# Patient Record
Sex: Female | Born: 1950 | Race: Black or African American | Hispanic: No | Marital: Married | Smoking: Never smoker
Health system: Southern US, Community
[De-identification: ages and names within clinical notes are randomized; demographics above are authoritative.]

## PROBLEM LIST (undated history)

## (undated) DIAGNOSIS — F0390 Unspecified dementia without behavioral disturbance: Secondary | ICD-10-CM

## (undated) HISTORY — PX: CHOLECYSTECTOMY: SHX55

---

## 2012-09-08 ENCOUNTER — Emergency Department (HOSPITAL_COMMUNITY): Payer: Medicare Other

## 2012-09-08 ENCOUNTER — Encounter (HOSPITAL_COMMUNITY): Payer: Self-pay

## 2012-09-08 ENCOUNTER — Emergency Department (HOSPITAL_COMMUNITY)
Admission: EM | Admit: 2012-09-08 | Discharge: 2012-09-08 | Disposition: A | Discharge status: Home / Self Care | Payer: Medicare Other | Attending: Emergency Medicine | Admitting: Emergency Medicine

## 2012-09-08 DIAGNOSIS — R296 Repeated falls: Secondary | ICD-10-CM | POA: Insufficient documentation

## 2012-09-08 DIAGNOSIS — IMO0002 Reserved for concepts with insufficient information to code with codable children: Secondary | ICD-10-CM | POA: Insufficient documentation

## 2012-09-08 DIAGNOSIS — F29 Unspecified psychosis not due to a substance or known physiological condition: Secondary | ICD-10-CM | POA: Insufficient documentation

## 2012-09-08 DIAGNOSIS — S8990XA Unspecified injury of unspecified lower leg, initial encounter: Secondary | ICD-10-CM | POA: Insufficient documentation

## 2012-09-08 DIAGNOSIS — Z880 Allergy status to penicillin: Secondary | ICD-10-CM | POA: Insufficient documentation

## 2012-09-08 DIAGNOSIS — Z79899 Other long term (current) drug therapy: Secondary | ICD-10-CM | POA: Insufficient documentation

## 2012-09-08 DIAGNOSIS — Y9389 Activity, other specified: Secondary | ICD-10-CM | POA: Insufficient documentation

## 2012-09-08 DIAGNOSIS — F0390 Unspecified dementia without behavioral disturbance: Secondary | ICD-10-CM | POA: Insufficient documentation

## 2012-09-08 DIAGNOSIS — R5381 Other malaise: Secondary | ICD-10-CM | POA: Insufficient documentation

## 2012-09-08 DIAGNOSIS — Y929 Unspecified place or not applicable: Secondary | ICD-10-CM | POA: Insufficient documentation

## 2012-09-08 DIAGNOSIS — S99919A Unspecified injury of unspecified ankle, initial encounter: Secondary | ICD-10-CM | POA: Insufficient documentation

## 2012-09-08 HISTORY — DX: Unspecified dementia, unspecified severity, without behavioral disturbance, psychotic disturbance, mood disturbance, and anxiety: F03.90

## 2012-09-08 LAB — CBC WITH DIFFERENTIAL/PLATELET
Basophils Relative: 0 % (ref 0–1)
Eosinophils Absolute: 0.1 10*3/uL (ref 0.0–0.7)
Eosinophils Relative: 1 % (ref 0–5)
Hemoglobin: 13.1 g/dL (ref 12.0–15.0)
Lymphs Abs: 2.5 10*3/uL (ref 0.7–4.0)
MCH: 31.3 pg (ref 26.0–34.0)
MCHC: 32.8 g/dL (ref 30.0–36.0)
MCV: 95.2 fL (ref 78.0–100.0)
Monocytes Relative: 7 % (ref 3–12)
Neutrophils Relative %: 68 % (ref 43–77)
Platelets: 319 10*3/uL (ref 150–400)
RBC: 4.19 MIL/uL (ref 3.87–5.11)

## 2012-09-08 LAB — COMPREHENSIVE METABOLIC PANEL
ALT: 8 U/L (ref 0–35)
AST: 17 U/L (ref 0–37)
Albumin: 3.4 g/dL — ABNORMAL LOW (ref 3.5–5.2)
CO2: 27 mEq/L (ref 19–32)
Chloride: 103 mEq/L (ref 96–112)
GFR calc non Af Amer: >90 mL/min (ref >90)
Sodium: 138 mEq/L (ref 135–145)
Total Bilirubin: 0.6 mg/dL (ref 0.3–1.2)

## 2012-09-08 LAB — URINALYSIS, ROUTINE W REFLEX MICROSCOPIC
Glucose, UA: NEGATIVE mg/dL
Leukocytes, UA: NEGATIVE
Nitrite: NEGATIVE
Specific Gravity, Urine: 1.029 (ref 1.005–1.030)
pH: 7 (ref 5.0–8.0)

## 2012-09-08 NOTE — ED Provider Notes (Signed)
CSN: [id number]     Arrival date & time 09/08/12  2001 History     First MD Initiated Contact with Patient 09/08/12 2107     Chief Complaint  Patient presents with  . Fall  . Altered Mental Status   HPI  History provided by the patient's husband and sister. Patient is a 62 year old female with history of dementia who presents with concerns of worsening altered metal status and weakness. Patient and husband are from the [place] area but came to stay with family here in [place]. Patient has not had regular medical followup or care for the past 3 years per the family while living in [place]. Today he additional family felt patient was more weak in altered. They state that she is slouched to her right side in her wheelchair and also is having difficulty bearing weight with weakness in both legs. She especially favors her right leg when trying to stand. Last evening patient was near the commode and was reported to be found after a fall in the bathtub right next to the commode. There was no signs of trauma or injury at that time specifically. Patient has been eating and drinking. There has been no fever chills or sweats. No other aggravating or alleviating factors. No other associated symptoms.    Past Medical History  Diagnosis Date  . Dementia    Past Surgical History  Procedure Laterality Date  . Cholecystectomy     No family history on file. History  Substance Use Topics  . Smoking status: Never Smoker   . Smokeless tobacco: Not on file  . Alcohol Use: No   OB History   Grav Para Term Preterm Abortions TAB SAB Ect Mult Living                 Review of Systems  Unable to perform ROS: Patient nonverbal  Constitutional: Negative for fever.  Respiratory: Negative for cough.   Neurological: Positive for weakness. Negative for headaches.  Psychiatric/Behavioral: Positive for confusion.    Allergies  Penicillins  Home Medications   Current Outpatient Rx  Name  Route   Sig  Dispense  Refill  . Multiple Vitamin (MULTIVITAMIN WITH MINERALS) TABS   Oral   Take 1 tablet by mouth daily.          BP 147/90  Pulse 103  Temp(Src) 99.4 F (37.4 C) (Rectal)  Resp 20  SpO2 100% Physical Exam  Nursing note and vitals reviewed. Constitutional: She appears well-developed and well-nourished. No distress.  HENT:  Head: Normocephalic.  Eyes: Conjunctivae and EOM are normal. Pupils are equal, round, and reactive to light.  Cardiovascular: Normal rate and regular rhythm.   Pulmonary/Chest: Effort normal and breath sounds normal. No respiratory distress. She has no wheezes.  Abdominal: Soft. There is no tenderness. There is no rebound and no guarding.  Musculoskeletal: Normal range of motion.  No gross deformities or injuries to extremities. There appears to be mild chronic swelling to the right knee area. Normal passive range of motion. Normal color.  Neurological: She is alert.  Does not communicate. Unable to follow commands. Patient does try to remove clothing during examination.  Skin: Skin is warm and dry. No rash noted.  Psychiatric: She has a normal mood and affect. Her behavior is normal.    ED Course   Procedures   Results for orders placed during the hospital encounter of 09/08/12  URINALYSIS, ROUTINE W REFLEX MICROSCOPIC      Result Value Range  Color, Urine YELLOW  YELLOW   APPearance CLEAR  CLEAR   Specific Gravity, Urine 1.029  1.005 - 1.030   pH 7.0  5.0 - 8.0   Glucose, UA NEGATIVE  NEGATIVE mg/dL   Hgb urine dipstick NEGATIVE  NEGATIVE   Bilirubin Urine NEGATIVE  NEGATIVE   Ketones, ur NEGATIVE  NEGATIVE mg/dL   Protein, ur NEGATIVE  NEGATIVE mg/dL   Urobilinogen, UA 2.0 (*) 0.0 - 1.0 mg/dL   Nitrite NEGATIVE  NEGATIVE   Leukocytes, UA NEGATIVE  NEGATIVE  COMPREHENSIVE METABOLIC PANEL      Result Value Range   Sodium 138  135 - 145 mEq/L   Potassium 3.8  3.5 - 5.1 mEq/L   Chloride 103  96 - 112 mEq/L   CO2 27  19 - 32 mEq/L    Glucose, Bld 118 (*) 70 - 99 mg/dL   BUN 13  6 - 23 mg/dL   Creatinine, Ser 4.09  0.50 - 1.10 mg/dL   Calcium 9.3  8.4 - 81.1 mg/dL   Total Protein 7.1  6.0 - 8.3 g/dL   Albumin 3.4 (*) 3.5 - 5.2 g/dL   AST 17  0 - 37 U/L   ALT 8  0 - 35 U/L   Alkaline Phosphatase 60  39 - 117 U/L   Total Bilirubin 0.6  0.3 - 1.2 mg/dL   GFR calc non Af Amer >90  >90 mL/min   GFR calc Af Amer >90  >90 mL/min  CBC WITH DIFFERENTIAL      Result Value Range   WBC 10.3  4.0 - 10.5 K/uL   RBC 4.19  3.87 - 5.11 MIL/uL   Hemoglobin 13.1  12.0 - 15.0 g/dL   HCT 91.4  78.2 - 95.6 %   MCV 95.2  78.0 - 100.0 fL   MCH 31.3  26.0 - 34.0 pg   MCHC 32.8  30.0 - 36.0 g/dL   RDW 21.3  08.6 - 57.8 %   Platelets 319  150 - 400 K/uL   Neutrophils Relative % 68  43 - 77 %   Neutro Abs 7.0  1.7 - 7.7 K/uL   Lymphocytes Relative 24  12 - 46 %   Lymphs Abs 2.5  0.7 - 4.0 K/uL   Monocytes Relative 7  3 - 12 %   Monocytes Absolute 0.7  0.1 - 1.0 K/uL   Eosinophils Relative 1  0 - 5 %   Eosinophils Absolute 0.1  0.0 - 0.7 K/uL   Basophils Relative 0  0 - 1 %   Basophils Absolute 0.0  0.0 - 0.1 K/uL       Dg Chest 2 View  09/08/2012   *RADIOLOGY REPORT*  Clinical Data: Recent traumatic injury, altered mental status  CHEST - 2 VIEW  Comparison: None.  Findings: The heart and pulmonary vascularity are within normal limits.  The lungs are clear bilaterally.  No acute bony abnormality is seen.  IMPRESSION: No acute abnormality noted.   Original Report Authenticated By: Alcide Clever, M.D.   Dg Pelvis 1-2 Views  09/08/2012   *RADIOLOGY REPORT*  Clinical Data: Fall.  PELVIS - 1-2 VIEW  Comparison: None.  Findings: No pelvic fracture or hip fracture is identified.  Marked degenerative changes are seen involving both hips, right greater than left.  No evidence of diastasis.  Soft tissues are unremarkable.  IMPRESSION: No evidence of pelvic fracture.   Original Report Authenticated By: Irish Lack, M.D.   Ct Head Wo  Contrast  09/08/2012   *RADIOLOGY REPORT*  Clinical Data: Altered mental status, weakness  CT HEAD WITHOUT CONTRAST  Technique:  Contiguous axial images were obtained from the base of the skull through the vertex without contrast.  Comparison: None.  Findings: Cortical volume loss noted with proportional ventricular prominence.  Periventricular white matter hypodensity likely indicates small vessel ischemic change.  No acute hemorrhage, acute infarction, or mass lesion is identified.  These findings are out of proportion to that expected for the patient's age of 62 years. Remote basal ganglial lacunar infarcts are noted.  No midline shift. Soft tissue density in the bilateral external auditory canals likely represents cerumen.  No skull fracture.  Orbits and paranasal sinuses are intact.  IMPRESSION: No acute intracranial finding.   Original Report Authenticated By: Christiana Pellant, M.D.    1. Dementia, without behavioral disturbance      MDM  Patient seen and evaluated. Patient sitting in wheelchair appears calm no acute distress. Per family she has at baseline and does not communicate.  CT of head and x-rays unremarkable. Patient's labs and UA also normal. Patient's family now stating that they are unable to care for the patient due to her severe dementia and requesting case management consult.  Social work has seen the patient. Case management also consult at. At this time family is comfortable taking the patient back home for the night and we will provide resources so they may begin looking for long-term care facilities.   Angus Seller, PA-C 09/09/12 2328

## 2012-09-08 NOTE — Progress Notes (Signed)
CSW spoke with family at bedside.  Pt kept her head covered with sheet most of the time.  Pts sister requested referrals for care.  She reports that her sister and brother in-law have been staying with her in her townhome for about a week and that they can't care for her alone because she works full time and pts husband is disabled.  CSW consulted with [facility] on call and was told that if there was not a skilled need then the pts insurance would not pay for home health services and it would be private pay.  CSW gave family information on ALF/SNF in the [place] area as well as the [facility] area where pt is originally a resident.  CSW gave family listing of home health agencies. Pt sister states that she has been working with an APS from [facility] concerning her sister.  CSW suggested that family follow up with [facility] for possible resources.  Family was appreciative of referrals and CSW concern.  Janet Griffin, Janet Griffin  [phone]  .09/08/2012  11:45 pm

## 2012-09-08 NOTE — ED Notes (Signed)
This morning pt lost balance; family states has noticed changes since Sunday evening; sister states when she got home from work at 630pm realized pt worse; states leaning and slouching; unable to ambulate due to leg weakness; states dragging right leg since Thursday; pt nonverbal for a while per sister with history of dementia; no medical treatment x 3 yrs

## 2012-09-09 NOTE — Progress Notes (Signed)
[facility] ED CM used Medicare.gov to find a listing of medicare providers near zip [phone] (near trina home address and office). The first MD noted is Harvette C Cindra Eves, Tri Marin Shutter, Vashti Hey velazquez  [address] [place], [place] [address] [phone] Cm spke with Cordelia Pen at the office to confirm MD is accepting new medicare patients and requested CM fax clinicals, face sheet to ATTN fax [phone]. Sherri will call Darreld Mclean or Darreld Mclean can call her by Monday if no return calls No appointments available on tomorrow, Thursday and office closed on this Friday  1127 am Spoke with Tessler at [phone] [facility] family practice [address] who is accepting new medicare patient but "there is a long wait list Please try back on Monday" 1131 Katina at [facility] [address], [place] [address] [phone] Confirmed they are not accepting new medicare patients 1139 attempts to reach drsE LIAS [id number] [place], [place] [address] [phone]  &  Jackie Plum [address] [place], [place] [address] [phone] But these were incorrect numbers Left a voice message for Diane at Adult care center [phone] at 1139 requesting a return call if availability for pt to be seen this week as a new medicare pt Left Diane Cm's office and Cm numbers for a return call

## 2012-09-09 NOTE — Progress Notes (Signed)
[facility] ED CM consulted by ED SWs about pt concerns about resource options for pt with Dementia being assisted in her care by her Janet Griffin, from 09/08/12 [facility] ED visit.   [id number] CM placed a call to Janet Griffin, sister, at [phone]. Janet Griffin confirmed the pt is staying with her at her townhome [address] [place] [address] since Thursday, September 03 2012. Reports prior to this the pt was staying with her daughter in [place] until an eviction and APS concern developed.  The daughter brought pt and her husband to stay with [place]. Confirmed coverage is Medicare with some unknown coverage from husband's Veteran's Administration status. Other support systems include family in [place] (another sister).   CM discussed with T Manson Passey the importance of 1) an established pcp relationship in [place] to get orders for home health services/DME and/or placement assistance 2) [facility] DSS assistance- application, community resources - reports she has spoken with the [place] DSS about transferring information to [place] on Monday 7/28 2014 3) [employer] assistance.   CM reviewed in details medicare guidelines, home health [facility]) (length of stay in home, types of [facility] staff available, coverage, primary caregiver, up to 24 hrs before services may be started) and Private duty nursing (PDN-coverage, length of stay in the home types of staff available). CM reviewed availability of HH SW to assist pcp to get pt to alf/snf (if desired disposition) from the community level.  Wt approximately 130 lbs and ht approximately 5'5"-5'6" per sister Confirms pt has not been seen by a doctor in 5-6 years and has not need of a rolling walker in the last 3 years until recently weakness noted. Discussed the locations of the local [facility] hospitals in [place], [place] and clinic in [place], [place] Janet Griffin reports recent transfer to [place] for her job and not being  familiar with the county  [place] agreed to allow CM assist with new medicare pcp with an appointment (within the [place] [address] at around 1200- 1300) and possible RW. Reports pt unable to lift her legs and having difficulty with completing all ADLS. Cm can return a call to her at her office number [phone]. Janet Griffin state she will be following up in the resource information provided (DSS, [place]). Janet Griffin reports pt

## 2012-09-09 NOTE — Progress Notes (Signed)
ED CM updated Trina on the pending call from Sherri of Dr Lovell Sheehan office to her for appointment and rolling walker assist Reviewed other office calls with pending call also from Diane at the adult care center. Darreld Mclean states she has heard from [employer] APS also. Cm received confirmation fax at [phone] & [phone]

## 2012-09-09 NOTE — Progress Notes (Addendum)
CSW received call from pt sister Darreld Mclean brown [phone]. Pt sister shared she was instructed to call back in the morning to speak with a nurse case manager. Per discussion with pt sister, pt is currently living with patient, patient has history of dementia, and is a fall risk. Pt sister shared that APS of [place] was involved and had patient emergency placed with patient sister. Patient sister is concerned about patient safety at home and is interested in placement. Patient sister stated she has left message with APS  in [place], as [place] referred to [employer] APS. This Clinical research associate called [facility] APS  who stated that [facility] had just called. CSW awaiting return call back from [facility] APS to confirm and provide any further information needed.   Doree Albee  [phone] 09/09/2012 8:16am   Addendum: CSW received call from Marye Round [phone] from [facility] APS, and patient will be referred to Placement CSW at DSS who will assist. Per [facility] APS, patient is in need of placement and not protection. Per [place] DSS patient placement will be complicated due to pt daughter still has POA, Patient sister is working with DSS to pursue guardianship to eventually place patient.   Catha Gosselin, LCSWA  [phone] .09/09/2012 11:40am

## 2012-09-10 NOTE — ED Provider Notes (Signed)
Medical screening examination/treatment/procedure(s) were performed by non-physician practitioner and as supervising physician I was immediately available for consultation/collaboration.   Javiel Canepa, MD 09/10/12 1759 

## 2012-11-26 ENCOUNTER — Telehealth: Payer: Self-pay | Admitting: General Practice

## 2012-11-26 NOTE — Telephone Encounter (Signed)
Called pt to set up an appt to Establish care.  Pt's sister say pt will be moving into a assisted living facility and will have a PCP onsite, if situation changes they will give [place] a call to schedule an appt.

## 2014-05-15 ENCOUNTER — Encounter (HOSPITAL_BASED_OUTPATIENT_CLINIC_OR_DEPARTMENT_OTHER): Payer: Self-pay | Admitting: *Deleted

## 2014-05-15 ENCOUNTER — Emergency Department (HOSPITAL_BASED_OUTPATIENT_CLINIC_OR_DEPARTMENT_OTHER): Payer: Medicare Other

## 2014-05-15 ENCOUNTER — Emergency Department (HOSPITAL_BASED_OUTPATIENT_CLINIC_OR_DEPARTMENT_OTHER)
Admission: EM | Admit: 2014-05-15 | Discharge: 2014-05-16 | Disposition: A | Discharge status: Home / Self Care | Payer: Medicare Other | Attending: Emergency Medicine | Admitting: Emergency Medicine

## 2014-05-15 DIAGNOSIS — F0390 Unspecified dementia without behavioral disturbance: Secondary | ICD-10-CM | POA: Diagnosis not present

## 2014-05-15 DIAGNOSIS — S0990XA Unspecified injury of head, initial encounter: Secondary | ICD-10-CM | POA: Insufficient documentation

## 2014-05-15 DIAGNOSIS — Y998 Other external cause status: Secondary | ICD-10-CM | POA: Diagnosis not present

## 2014-05-15 DIAGNOSIS — Z79899 Other long term (current) drug therapy: Secondary | ICD-10-CM | POA: Insufficient documentation

## 2014-05-15 DIAGNOSIS — Y9289 Other specified places as the place of occurrence of the external cause: Secondary | ICD-10-CM | POA: Insufficient documentation

## 2014-05-15 DIAGNOSIS — W19XXXA Unspecified fall, initial encounter: Secondary | ICD-10-CM

## 2014-05-15 DIAGNOSIS — S0003XA Contusion of scalp, initial encounter: Secondary | ICD-10-CM | POA: Diagnosis not present

## 2014-05-15 DIAGNOSIS — W01198A Fall on same level from slipping, tripping and stumbling with subsequent striking against other object, initial encounter: Secondary | ICD-10-CM | POA: Insufficient documentation

## 2014-05-15 DIAGNOSIS — Y9389 Activity, other specified: Secondary | ICD-10-CM | POA: Insufficient documentation

## 2014-05-15 DIAGNOSIS — Z880 Allergy status to penicillin: Secondary | ICD-10-CM | POA: Insufficient documentation

## 2014-05-15 MED ORDER — ACETAMINOPHEN 325 MG PO TABS
650.0000 mg | ORAL_TABLET | Freq: Once | ORAL | Status: AC
  Administered 2014-05-15: 650 mg via ORAL
  Filled 2014-05-15: qty 2

## 2014-05-15 NOTE — ED Provider Notes (Addendum)
CSN: [id number]     Arrival date & time 05/15/14  2042 History  This chart was scribed for Doug Sou, MD by Tonye Royalty, ED Scribe. This patient was seen in room MH06/MH06 and the patient's care was started at 9:47 PM.    LEVEL 5 CAVEAT: dementia History is obtained from patient's sister, who accompanies her Chief Complaint  Patient presents with  . Fall  . Head Injury   The history is provided by the patient and a relative. The history is limited by the condition of the patient. No language interpreter was used.    HPI Comments: Janet Griffin is a 64 y.o. female resident at [facility] with history of dementia who presents to the Emergency Department complaining of fall and head injury at 2000 tonight. Per sister, she was observed to fall to knees and strike her head on her bedside table at nursing home. Sister states she looks fine now besides a bump on her head,and swelling at her right knee but is acting normally. She also has wound on right knee. Gait is unsteady at baseline. She states the patient normally ambulates inpendently but has difficulty due to arthritis in her knee. She intermittently has physical therapy. She has lived at the nursing almost 2 years and is planned to be there permanently. Last tetanus shot was less than 10 years ago. No treatment prior to coming here. Brought by EMS  Past Medical History  Diagnosis Date  . Dementia    Past Surgical History  Procedure Laterality Date  . Cholecystectomy     No family history on file. History  Substance Use Topics  . Smoking status: Never Smoker   . Smokeless tobacco: Not on file  . Alcohol Use: No   OB History    No data available     Review of Systems  Unable to perform ROS: Dementia  HENT:       Hematoma on head  Skin: Positive for wound.  Neurological:       Head injury    Allergies  Penicillins  Home Medications   Prior to Admission medications   Medication Sig Start Date End Date  Taking? Authorizing Provider  memantine (NAMENDA) 10 MG tablet Take 30 mg by mouth 2 (two) times daily.   Yes Historical Provider, MD  traZODone (DESYREL) 100 MG tablet Take 100 mg by mouth at bedtime.   Yes Historical Provider, MD  Multiple Vitamin (MULTIVITAMIN WITH MINERALS) TABS Take 1 tablet by mouth daily.    Historical Provider, MD   BP 175/85 mmHg  Pulse 87  Temp(Src) 98 F (36.7 C) (Oral)  Resp 24  SpO2 100% Physical Exam  Constitutional: She appears well-developed and well-nourished. No distress.  HENT:  Golf ball size hematoma at right parietal scalp. Otherwise normocephalic atraumatic. Bilateral tympanic membranes normal. Poor dentition.  Eyes: Conjunctivae are normal. Pupils are equal, round, and reactive to light.  Neck: Neck supple. No tracheal deviation present. No thyromegaly present.  nontender  Cardiovascular: Normal rate and regular rhythm.   No murmur heard. Pulmonary/Chest: Effort normal and breath sounds normal.  Abdominal: Soft. Bowel sounds are normal. She exhibits no distension. There is no tenderness.  Musculoskeletal: Normal range of motion. She exhibits no edema or tenderness.  No tenderness along entire spine. Pelvis stable nontender. Right lower extremity dime-sized abrasion over anterior knee with corresponding swelling. DP pulse 2+. No ligamentous laxity .all other extremities no contusion abrasion or tenderness neurovascularly intact  Neurological: She is alert.  Coordination normal.  does not follow simple commands, nonverbal, moves all extremities  Skin: Skin is warm and dry. No rash noted.  Psychiatric: She has a normal mood and affect.  Nursing note and vitals reviewed.   ED Course  Procedures (including critical care time)  DIAGNOSTIC STUDIES: Oxygen Saturation is 100% on room air, normal by my interpretation.    COORDINATION OF CARE: 10:07 PM Discussed treatment plan with patient at beside, the patient agrees with the plan and has no  further questions at this time.   Labs Review Labs Reviewed - No data to display  Imaging Review No results found.   EKG Interpretation None      12 midnight patient appears to be resting comfortably.looks at baseline. Her sister who accompanies her Results for orders placed or performed during the hospital encounter of 09/08/12  Urinalysis, Routine w reflex microscopic  Result Value Ref Range   Color, Urine YELLOW YELLOW   APPearance CLEAR CLEAR   Specific Gravity, Urine 1.029 1.005 - 1.030   pH 7.0 5.0 - 8.0   Glucose, UA NEGATIVE NEGATIVE mg/dL   Hgb urine dipstick NEGATIVE NEGATIVE   Bilirubin Urine NEGATIVE NEGATIVE   Ketones, ur NEGATIVE NEGATIVE mg/dL   Protein, ur NEGATIVE NEGATIVE mg/dL   Urobilinogen, UA 2.0 (H) 0.0 - 1.0 mg/dL   Nitrite NEGATIVE NEGATIVE   Leukocytes, UA NEGATIVE NEGATIVE  Comprehensive metabolic panel  Result Value Ref Range   Sodium 138 135 - 145 mEq/L   Potassium 3.8 3.5 - 5.1 mEq/L   Chloride 103 96 - 112 mEq/L   CO2 27 19 - 32 mEq/L   Glucose, Bld 118 (H) 70 - 99 mg/dL   BUN 13 6 - 23 mg/dL   Creatinine, Ser 1.61 0.50 - 1.10 mg/dL   Calcium 9.3 8.4 - 09.6 mg/dL   Total Protein 7.1 6.0 - 8.3 g/dL   Albumin 3.4 (L) 3.5 - 5.2 g/dL   AST 17 0 - 37 U/L   ALT 8 0 - 35 U/L   Alkaline Phosphatase 60 39 - 117 U/L   Total Bilirubin 0.6 0.3 - 1.2 mg/dL   GFR calc non Af Amer >90 >90 mL/min   GFR calc Af Amer >90 >90 mL/min  CBC with Differential  Result Value Ref Range   WBC 10.3 4.0 - 10.5 K/uL   RBC 4.19 3.87 - 5.11 MIL/uL   Hemoglobin 13.1 12.0 - 15.0 g/dL   HCT 04.5 40.9 - 81.1 %   MCV 95.2 78.0 - 100.0 fL   MCH 31.3 26.0 - 34.0 pg   MCHC 32.8 30.0 - 36.0 g/dL   RDW 91.4 78.2 - 95.6 %   Platelets 319 150 - 400 K/uL   Neutrophils Relative % 68 43 - 77 %   Neutro Abs 7.0 1.7 - 7.7 K/uL   Lymphocytes Relative 24 12 - 46 %   Lymphs Abs 2.5 0.7 - 4.0 K/uL   Monocytes Relative 7 3 - 12 %   Monocytes Absolute 0.7 0.1 - 1.0 K/uL    Eosinophils Relative 1 0 - 5 %   Eosinophils Absolute 0.1 0.0 - 0.7 K/uL   Basophils Relative 0 0 - 1 %   Basophils Absolute 0.0 0.0 - 0.1 K/uL   Ct Head Wo Contrast  05/15/2014   CLINICAL DATA:  Fall and head injury at 2000 hours tonight. Fell to the knees and struck head on bedside table. Right parietal scalp hematoma.  EXAM: CT HEAD WITHOUT CONTRAST  CT CERVICAL SPINE WITHOUT CONTRAST  TECHNIQUE: Multidetector CT imaging of the head and cervical spine was performed following the standard protocol without intravenous contrast. Multiplanar CT image reconstructions of the cervical spine were also generated.  COMPARISON:  CT head 09/08/2012  FINDINGS: CT HEAD FINDINGS  Prominent diffuse cerebral atrophy. Ventricular dilatation likely due to central atrophy. Low-attenuation changes in the deep white matter consistent with small vessel ischemia. No mass effect or midline shift. No abnormal extra-axial fluid collections. Gray-white matter junctions are distinct. Basal cisterns are not effaced. No evidence of acute intracranial hemorrhage. No depressed skull fractures. Visualized paranasal sinuses and mastoid air cells are not opacified.  CT CERVICAL SPINE FINDINGS  There is reversal of the cervical lordosis which may be due to patient positioning or degenerative changes but ligamentous injury or muscle spasm can also have this appearance and are not excluded. No anterior subluxation. Normal alignment of the facet joints. C1-2 articulation appears intact. No vertebral compression deformities. Diffuse degenerative change throughout the cervical spine with narrowed cervical interspaces and prominent endplate hypertrophic changes. Degenerative changes throughout the facet joints. No prevertebral soft tissue swelling. No focal bone lesion or bone destruction. Bone cortex and trabecular architecture appear intact. Soft tissues are unremarkable.  IMPRESSION: No acute intracranial abnormalities. Chronic atrophy and small  vessel ischemic changes.  Degenerative changes in the cervical spine. Nonspecific reversal of the usual cervical lordosis. No displaced acute fractures identified.   Electronically Signed   By: Burman NievesWilliam  Stevens M.D.   On: 05/15/2014 23:15   Ct Cervical Spine Wo Contrast  05/15/2014   CLINICAL DATA:  Fall and head injury at 2000 hours tonight. Fell to the knees and struck head on bedside table. Right parietal scalp hematoma.  EXAM: CT HEAD WITHOUT CONTRAST  CT CERVICAL SPINE WITHOUT CONTRAST  TECHNIQUE: Multidetector CT imaging of the head and cervical spine was performed following the standard protocol without intravenous contrast. Multiplanar CT image reconstructions of the cervical spine were also generated.  COMPARISON:  CT head 09/08/2012  FINDINGS: CT HEAD FINDINGS  Prominent diffuse cerebral atrophy. Ventricular dilatation likely due to central atrophy. Low-attenuation changes in the deep white matter consistent with small vessel ischemia. No mass effect or midline shift. No abnormal extra-axial fluid collections. Gray-white matter junctions are distinct. Basal cisterns are not effaced. No evidence of acute intracranial hemorrhage. No depressed skull fractures. Visualized paranasal sinuses and mastoid air cells are not opacified.  CT CERVICAL SPINE FINDINGS  There is reversal of the cervical lordosis which may be due to patient positioning or degenerative changes but ligamentous injury or muscle spasm can also have this appearance and are not excluded. No anterior subluxation. Normal alignment of the facet joints. C1-2 articulation appears intact. No vertebral compression deformities. Diffuse degenerative change throughout the cervical spine with narrowed cervical interspaces and prominent endplate hypertrophic changes. Degenerative changes throughout the facet joints. No prevertebral soft tissue swelling. No focal bone lesion or bone destruction. Bone cortex and trabecular architecture appear intact. Soft  tissues are unremarkable.  IMPRESSION: No acute intracranial abnormalities. Chronic atrophy and small vessel ischemic changes.  Degenerative changes in the cervical spine. Nonspecific reversal of the usual cervical lordosis. No displaced acute fractures identified.   Electronically Signed   By: Burman NievesWilliam  Stevens M.D.   On: 05/15/2014 23:15   Dg Knee Complete 4 Views Right  05/15/2014   CLINICAL DATA:  Larey SeatFell at 2000 hours tonight. Fell to the knees with wound on right knee.  EXAM: RIGHT KNEE - COMPLETE  4+ VIEW  COMPARISON:  None.  FINDINGS: Degenerative changes in the right knee with medial greater than lateral compartment narrowing. Hypertrophic changes most prominent in the patellofemoral compartment. No significant effusion. No evidence of acute fracture or dislocation.  IMPRESSION: Negative.   Electronically Signed   By: Burman Nieves M.D.   On: 05/15/2014 23:31    MDM  Plan Tylenol for pain. Diagnoses #1 fall #2 contusions multiple sites Final diagnoses:  None      I personally performed the services described in this documentation, which was scribed in my presence. The recorded information has been reviewed and considered.    Doug Sou, MD 05/16/14 6045  Doug Sou, MD 05/16/14 4098

## 2014-05-15 NOTE — ED Notes (Addendum)
I placed patient in gown upon arrival. Patient is agressively grinding her teeth. She kept pulling off oxy probe so I placed tape-on probe to get reading. I took full set of vitals. There is a small contusion to patient's head under braid line (top/back of head). I also noticed a small abrasion to patient's right knee.

## 2014-05-15 NOTE — ED Notes (Signed)
[facility]. Home staff state on arrival to her room to get her supper tray. Pt observed to fall to knees and hit head on bedside table. No LOC.

## 2014-05-16 NOTE — ED Notes (Signed)
Discharge instructions given to pt's sister and called to [place], med tech at [facility]. [facility] has been called for transport. [name], Ashanti Littles Lee, RN

## 2014-05-16 NOTE — ED Notes (Signed)
[facility] arrived to transport pt. Janet Griffin, Janet Soltero Lee, RN

## 2014-05-16 NOTE — Discharge Instructions (Signed)
Contusion Ms. Lenard GallowayMickey can have Tylenol 650 mg every 4 hours as needed for pain. CT scans of the brain, cervical spine and x-ray of the right knee showed no broken bones or brain injury A contusion is a deep bruise. Contusions happen when an injury causes bleeding under the skin. Signs of bruising include pain, puffiness (swelling), and discolored skin. The contusion may turn blue, purple, or yellow. HOME CARE   Put ice on the injured area.  Put ice in a plastic bag.  Place a towel between your skin and the bag.  Leave the ice on for 15-20 minutes, 03-04 times a day.  Only take medicine as told by your doctor.  Rest the injured area.  If possible, raise (elevate) the injured area to lessen puffiness. GET HELP RIGHT AWAY IF:   You have more bruising or puffiness.  You have pain that is getting worse.  Your puffiness or pain is not helped by medicine. MAKE SURE YOU:   Understand these instructions.  Will watch your condition.  Will get help right away if you are not doing well or get worse. Document Released: 07/17/2007 Document Revised: 04/22/2011 Document Reviewed: 12/03/2010 [facility] Patient Information 2015 [employer] [place]. This information is not intended to replace advice given to you by your health care provider. Make sure you discuss any questions you have with your health care provider.

## 2015-10-31 ENCOUNTER — Non-Acute Institutional Stay (SKILLED_NURSING_FACILITY): Payer: Medicare Other | Admitting: Internal Medicine

## 2015-10-31 ENCOUNTER — Encounter: Payer: Self-pay | Admitting: Internal Medicine

## 2015-10-31 DIAGNOSIS — K5909 Other constipation: Secondary | ICD-10-CM | POA: Diagnosis not present

## 2015-10-31 DIAGNOSIS — R1312 Dysphagia, oropharyngeal phase: Secondary | ICD-10-CM | POA: Diagnosis not present

## 2015-10-31 DIAGNOSIS — F0280 Dementia in other diseases classified elsewhere without behavioral disturbance: Secondary | ICD-10-CM

## 2015-10-31 DIAGNOSIS — G308 Other Alzheimer's disease: Secondary | ICD-10-CM

## 2015-10-31 DIAGNOSIS — I10 Essential (primary) hypertension: Secondary | ICD-10-CM | POA: Diagnosis not present

## 2015-10-31 DIAGNOSIS — K219 Gastro-esophageal reflux disease without esophagitis: Secondary | ICD-10-CM

## 2015-10-31 DIAGNOSIS — Z86718 Personal history of other venous thrombosis and embolism: Secondary | ICD-10-CM

## 2015-10-31 DIAGNOSIS — M62421 Contracture of muscle, right upper arm: Secondary | ICD-10-CM | POA: Diagnosis not present

## 2015-10-31 DIAGNOSIS — G4700 Insomnia, unspecified: Secondary | ICD-10-CM | POA: Diagnosis not present

## 2015-10-31 DIAGNOSIS — H04123 Dry eye syndrome of bilateral lacrimal glands: Secondary | ICD-10-CM

## 2015-10-31 DIAGNOSIS — E785 Hyperlipidemia, unspecified: Secondary | ICD-10-CM | POA: Diagnosis not present

## 2015-10-31 NOTE — Progress Notes (Signed)
[facility]  PCP: Keane PoliceSLADE-HARTMAN, [name], MD   Code Status: Full Code  Goals of care: Advanced Directive information No flowsheet data found.     Extended Emergency Contact Information Primary Emergency Contact: [facility] InstituteBrown,Trina Address: [address]          [place], [place] [address] [place] Home Phone: [phone] Work Phone: [phone] Relation: Sister Secondary Emergency Contact: Levey,James Address: [address]          [place], [place] [address] [place] Home Phone: [phone] Relation: Spouse   Allergies  Allergen Reactions  . Penicillins Hives    Chief Complaint  Patient presents with  . New Admit To SNF    New Admission Visit     HPI:  Patient is a 65 y.o. female seen today for new admission visit. She is here for long term care from another facility. She has medical history of chronic embolism, dysphagia, right shoulder contracture, HTN, dementia among others. She is under total care. She is non ambulatory. She has supportive family. She is on dysphagia diet and non verbal.    Review of Systems: Unable to obtain. Per staff, no fall reported. No pressure ulcer and wound reported.   Past Medical History:  Diagnosis Date  . Dementia    Past Surgical History:  Procedure Laterality Date  . CHOLECYSTECTOMY     Social History:   reports that she has never smoked. She does not have any smokeless tobacco history on file. She reports that she does not drink alcohol. Her drug history is not on file.  No family history on file.  Medications:   Medication List       Accurate as of 10/31/15 11:26 AM. Always use your most recent med list.          acetaminophen 325 MG tablet Commonly known as:  TYLENOL Take 650 mg by mouth 3 (three) times daily.   amLODipine 5 MG tablet Commonly known as:  NORVASC Take 5 mg by mouth daily.   ARTIFICIAL TEARS OP Apply 1 drop to eye 4 (four) times  daily as needed.   bisacodyl 10 MG suppository Commonly known as:  DULCOLAX Place 10 mg rectally daily as needed for moderate constipation.   capsaicin 0.025 % cream Commonly known as:  ZOSTRIX Apply 1 application topically 3 (three) times daily. Apply thin layer to bilateral knees   ELIQUIS 5 MG Tabs tablet Generic drug:  apixaban Take 5 mg by mouth daily.   hydrochlorothiazide 12.5 MG capsule Commonly known as:  MICROZIDE Take 12.5 mg by mouth daily.   HYDROcodone-acetaminophen 5-325 MG tablet Commonly known as:  NORCO/VICODIN Take 1 tablet by mouth every 6 (six) hours as needed for moderate pain. Take 1/2 tab   Melatonin 3 MG Tabs Take 1 tablet by mouth at bedtime.   memantine 10 MG tablet Commonly known as:  NAMENDA Take 30 mg by mouth 2 (two) times daily.   ondansetron 4 MG tablet Commonly known as:  ZOFRAN Take 4 mg by mouth every 8 (eight) hours as needed for nausea or vomiting.   polyethylene glycol packet Commonly known as:  MIRALAX / GLYCOLAX Take 17 g by mouth at bedtime.   ranitidine 75 MG tablet Commonly known as:  ZANTAC Take 75 mg by mouth at bedtime.   senna 8.6 MG tablet Commonly known as:  SENOKOT Take 1 tablet by mouth as needed for constipation.   traZODone 100 MG tablet Commonly known  as:  DESYREL Take 100 mg by mouth at bedtime.       Immunizations: Immunization History  Administered Date(s) Administered  . PPD Test 10/28/2015     Physical Exam:  Vitals:   10/31/15 1115  BP: 124/68  Pulse: 80  Resp: 20  Temp: 98 F (36.7 C)  TempSrc: Oral  SpO2: 96%   There is no height or weight on file to calculate BMI.  General- elderly female, well built, in no acute distress Head- normocephalic, atraumatic Throat- moist mucus membrane, halitosis present Eyes- PERRLA, EOMI, no pallor, no icterus, no discharge Neck- no cervical lymphadenopathy Cardiovascular- normal s1,s2, no murmur, no leg edema Respiratory- bilateral clear to  auscultation, no wheeze, no rhonchi, no crackles, no use of accessory muscles Abdomen- bowel sounds present, soft, non tender Musculoskeletal- contracture to right arm with spasticity noted, unable to move her legs, no leg edema, under total care Neurological- alert and oriented to self, non verbal Skin- warm and dry    Labs reviewed: none available for review   Assessment/Plan  Dementia without behavioral disturbance Provide supportive care. Fall precautions and pressure ulcer prophylaxis. Continue namenda  Right arm contracture With spasticity. Continue norco 5-325 mg half a tab qid scheduled and 1 tab q6h prn pain. To work with PT and OT  HTN Monitor bp reading, check bmp, continue amlodipine 5 mg daily and HCTZ 12.5 mg daily  Dysphagia On puree with honey thick liquid, aspiration precautions, SLP to follow  Dry eyes Continue artifical tear drop  Insomnia On melatonin and trazodone, monitor  gerd Stable. Continue ranitidine 75 mg daily at bedtime   History of left DVT On apixaban 5 mg daily. Continue this for now and to review records to evaluate for duration of treatment  Constipation On miralax and senna, continue this   Goals of care: long term care   Labs/tests ordered: cbc, cmp, lipid panel, tsh, a1c 11/01/15  Family/ staff Communication: reviewed care plan with patient and nursing supervisor    Oneal Grout, MD Internal Medicine [facility] [address] [place], [place] [address] Cell Phone (Monday-Friday 8 am - 5 pm): [phone] On Call: [phone] and follow prompts after 5 pm and on weekends Office Phone: [phone] Office Fax: [phone]

## 2015-11-02 LAB — HEPATIC FUNCTION PANEL
ALK PHOS: 60 U/L (ref 25–125)
ALT: 42 U/L — AB (ref 7–35)
AST: 26 U/L (ref 13–35)
Bilirubin, Total: 1.1 mg/dL

## 2015-11-02 LAB — CBC AND DIFFERENTIAL
HCT: 47 % — AB (ref 36–46)
Hemoglobin: 15.6 g/dL (ref 12.0–16.0)
Platelets: 330 10*3/uL (ref 150–399)
WBC: 6.2 10^3/mL

## 2015-11-02 LAB — LIPID PANEL
Cholesterol: 211 mg/dL — AB (ref 0–200)
HDL: 45 mg/dL (ref 35–70)
LDL CALC: 146 mg/dL
TRIGLYCERIDES: 101 mg/dL (ref 40–160)

## 2015-11-02 LAB — BASIC METABOLIC PANEL
BUN: 10 mg/dL (ref 4–21)
CREATININE: 0.8 mg/dL (ref 0.5–1.1)
Glucose: 120 mg/dL
POTASSIUM: 3.8 mmol/L (ref 3.4–5.3)
Sodium: 142 mmol/L (ref 137–147)

## 2015-11-02 LAB — HEMOGLOBIN A1C: Hemoglobin A1C: 5

## 2015-11-02 LAB — TSH: TSH: 2.17 u[IU]/mL (ref 0.41–5.90)

## 2015-11-06 ENCOUNTER — Encounter: Payer: Self-pay | Admitting: Family

## 2015-11-06 NOTE — Progress Notes (Signed)
Location:      Place of Service:    Provider:   Keane PoliceSLADE-HARTMAN, [name], MD  Patient Care Team: Keane PoliceVenezela Slade-Hartman, MD as PCP - General (Family Medicine)  Extended Emergency Contact Information Primary Emergency Contact: [facility],Trina Address: [address]          [place], [place] [address] [place] Home Phone: [phone] Work Phone: [phone] Relation: Sister Secondary Emergency Contact: Chestnut,James Address: [address] [place]          , [place] [address] [place] Home Phone: [phone] Relation: Spouse  Code Status  Goals of care: Advanced Directive information Advanced Directives 12/27/2015  Does Patient Have a Medical Advance Directive? Yes  Type of Advance Directive Out of facility DNR (pink MOST or yellow form)  Does patient want to make changes to medical advance directive? No - Patient declined  Copy of Healthcare Power of Attorney in Chart? Yes     Chief Complaint  Patient presents with  . Error  . Error    HPI:  Pt is a 65 y.o. female seen today for an acute visit for    Past Medical History:  Diagnosis Date  . Dementia    Past Surgical History:  Procedure Laterality Date  . CHOLECYSTECTOMY      Allergies  Allergen Reactions  . Penicillins Hives and Anaphylaxis    Allergies as of 11/06/2015      Reactions   Penicillins Hives      Medication List       Accurate as of 11/06/15 11:59 PM. Always use your most recent med list.          acetaminophen 325 MG tablet Commonly known as:  TYLENOL Take 650 mg by mouth 3 (three) times daily.   amLODipine 5 MG tablet Commonly known as:  NORVASC Take 5 mg by mouth daily.   ARTIFICIAL TEARS OP Apply 1 drop to eye 4 (four) times daily as needed.   bisacodyl 10 MG suppository Commonly known as:  DULCOLAX Place 10 mg rectally daily as needed for moderate constipation.   capsaicin 0.025 % cream Commonly known as:  ZOSTRIX Apply 1  application topically 3 (three) times daily. Apply thin layer to bilateral knees   ELIQUIS 5 MG Tabs tablet Generic drug:  apixaban Take 5 mg by mouth daily.   hydrochlorothiazide 12.5 MG capsule Commonly known as:  MICROZIDE Take 12.5 mg by mouth daily.   HYDROcodone-acetaminophen 5-325 MG tablet Commonly known as:  NORCO/VICODIN Take 1 tablet by mouth every 6 (six) hours as needed for moderate pain. Take 1/2 tab four times a day (hold for sedation)   Melatonin 3 MG Tabs Take 1 tablet by mouth at bedtime.   memantine 10 MG tablet Commonly known as:  NAMENDA Take 30 mg by mouth 2 (two) times daily.   ondansetron 4 MG tablet Commonly known as:  ZOFRAN Take 4 mg by mouth every 8 (eight) hours as needed for nausea or vomiting.   polyethylene glycol packet Commonly known as:  MIRALAX / GLYCOLAX Take 17 g by mouth at bedtime.   ranitidine 75 MG tablet Commonly known as:  ZANTAC Take 75 mg by mouth at bedtime.   senna 8.6 MG tablet Commonly known as:  SENOKOT Take 1 tablet by mouth at bedtime as needed for constipation.   traZODone 100 MG tablet Commonly known as:  DESYREL Take 100 mg by mouth at bedtime.       Review of Systems  Immunization  History  Administered Date(s) Administered  . Influenza-Unspecified 11/16/2015  . PPD Test 10/28/2015   There are no preventive care reminders to display for this patient. No flowsheet data found. Functional Status Survey:    Vitals:   There is no height or weight on file to calculate BMI. Physical Exam  Labs reviewed:  Recent Labs  12/17/15 0852  12/21/15 0400 12/22/15 0529 12/23/15 1340  NA  --   < > 136 140 139  K  --   < > 5.0 4.2 3.1*  CL  --   < > 108 117* 111  CO2  --   < > 24 18* 22  GLUCOSE  --   < > 306* 200* 104*  BUN  --   < > <5* <5* <5*  CREATININE  --   < > 0.61 0.47 0.53  CALCIUM  --   < > 7.4* 6.1* 7.7*  MG 2.2  --   --   --   --   < > = values in this interval not displayed.  Recent  Labs  11/02/15 12/15/15 2032 12/16/15 0030  AST 26 112* 94*  ALT 42* 156* 127*  ALKPHOS 60 77 62  BILITOT  --  3.8* 2.9*  PROT  --  7.8 6.9  ALBUMIN  --  3.9 3.5    Recent Labs  12/15/15 2032  12/20/15 0500 12/21/15 0400 12/22/15 0529  WBC 16.2*  < > 11.7* 7.7 8.7  NEUTROABS 14.2*  --   --   --  4.9  HGB 17.8*  < > 9.7* 9.6* 9.8*  HCT 55.5*  < > 30.2* 29.5* 30.1*  MCV 101.1*  < > 97.7 97.0 97.1  PLT 284  < > 178 163 192  < > = values in this interval not displayed. Lab Results  Component Value Date   TSH 0.906 12/16/2015   Lab Results  Component Value Date   HGBA1C 5.4 12/16/2015   Lab Results  Component Value Date   CHOL 211 (A) 11/02/2015   HDL 45 11/02/2015   LDLCALC 146 11/02/2015   TRIG 101 11/02/2015    Significant Diagnostic Results in last 30 days:  No results found.  Assessment/Plan There are no diagnoses linked to this encounter.   Family/ staff Communication:  Labs/tests ordered  This encounter was created in error - please disregard.

## 2015-11-09 LAB — BASIC METABOLIC PANEL
BUN: 14 mg/dL (ref 4–21)
CREATININE: 0.8 mg/dL (ref 0.5–1.1)
Glucose: 114 mg/dL
Potassium: 4.1 mmol/L (ref 3.4–5.3)
Sodium: 140 mmol/L (ref 137–147)

## 2015-11-09 LAB — CBC AND DIFFERENTIAL
HEMATOCRIT: 50 % — AB (ref 36–46)
Hemoglobin: 16.3 g/dL — AB (ref 12.0–16.0)
WBC: 6.6 10^3/mL

## 2015-11-30 ENCOUNTER — Encounter: Payer: Self-pay | Admitting: Internal Medicine

## 2015-11-30 ENCOUNTER — Non-Acute Institutional Stay (SKILLED_NURSING_FACILITY): Payer: Medicare Other | Admitting: Internal Medicine

## 2015-11-30 DIAGNOSIS — E7800 Pure hypercholesterolemia, unspecified: Secondary | ICD-10-CM | POA: Insufficient documentation

## 2015-11-30 DIAGNOSIS — Z86718 Personal history of other venous thrombosis and embolism: Secondary | ICD-10-CM | POA: Diagnosis not present

## 2015-11-30 DIAGNOSIS — I10 Essential (primary) hypertension: Secondary | ICD-10-CM | POA: Diagnosis not present

## 2015-11-30 DIAGNOSIS — K219 Gastro-esophageal reflux disease without esophagitis: Secondary | ICD-10-CM | POA: Insufficient documentation

## 2015-11-30 NOTE — Progress Notes (Signed)
[facility]  PCP: Keane PoliceSLADE-HARTMAN, [name], MD   Code Status: Full Code  Goals of care: Advanced Directive information Advanced Directives 11/06/2015  Does patient have an advance directive? No       Extended Emergency Contact Information Primary Emergency Contact: [facility] Address: [address]          [place], [place] [address] [place] Home Phone: [phone] Work Phone: [phone] Relation: Sister Secondary Emergency Contact: Kimball,James Address: [address]          [place], [place] [address] [place] Home Phone: [phone] Relation: Spouse   Allergies  Allergen Reactions  . Penicillins Hives    Chief Complaint  Patient presents with  . Medical Management of Chronic Issues    Routine Visit     HPI:  Patient is a 65 y.o. female seen today for long term care. She has been at her baseline per nursing staff. She is unable to participate in HPI and ROS. She is bed bound and is under total care. She has advanced dementia.  Review of Systems: Unable to obtain    Past Medical History:  Diagnosis Date  . Dementia    Past Surgical History:  Procedure Laterality Date  . CHOLECYSTECTOMY       Medications:   Medication List       Accurate as of 11/30/15 11:37 AM. Always use your most recent med list.          amLODipine 5 MG tablet Commonly known as:  NORVASC Take 5 mg by mouth daily.   ARTIFICIAL TEARS OP Apply 1 drop to eye 4 (four) times daily as needed.   bisacodyl 10 MG suppository Commonly known as:  DULCOLAX Place 10 mg rectally daily as needed for moderate constipation.   capsaicin 0.025 % cream Commonly known as:  ZOSTRIX Apply 1 application topically 3 (three) times daily. Apply thin layer to bilateral knees   ELIQUIS 5 MG Tabs tablet Generic drug:  apixaban Take 5 mg by mouth daily.   hydrochlorothiazide 12.5 MG capsule Commonly known as:  MICROZIDE Take  12.5 mg by mouth daily.   HYDROcodone-acetaminophen 5-325 MG tablet Commonly known as:  NORCO/VICODIN Take 1 tablet by mouth every 6 (six) hours as needed for moderate pain. Take 1/2 tab four times a day (hold for sedation)   Melatonin 3 MG Tabs Take 1 tablet by mouth at bedtime.   memantine 10 MG tablet Commonly known as:  NAMENDA Take 30 mg by mouth 2 (two) times daily.   ondansetron 4 MG tablet Commonly known as:  ZOFRAN Take 4 mg by mouth every 8 (eight) hours as needed for nausea or vomiting.   polyethylene glycol packet Commonly known as:  MIRALAX / GLYCOLAX Take 17 g by mouth at bedtime.   ranitidine 75 MG tablet Commonly known as:  ZANTAC Take 75 mg by mouth at bedtime.   senna 8.6 MG tablet Commonly known as:  SENOKOT Take 1 tablet by mouth as needed for constipation.   simvastatin 10 MG tablet Commonly known as:  ZOCOR Take 10 mg by mouth daily.   traZODone 100 MG tablet Commonly known as:  DESYREL Take 100 mg by mouth at bedtime.       Immunizations: Immunization History  Administered Date(s) Administered  . PPD Test 10/28/2015     Physical Exam: Vitals:   11/30/15 1129  BP: (!) 142/89  Pulse: 76  Resp: 18  Temp: 98.2 F (36.8  C)  TempSrc: Oral  SpO2: 96%  Weight: 176 lb 3.2 oz (79.9 kg)  Height: 5\' 7"  (1.702 m)   Body mass index is 27.6 kg/m.  General- adult female, well built, chronically ill appearing, in no acute distress, non verbal Head- normocephalic, atraumatic Nose- no nasal discharge Throat- moist mucus membrane Eyes- PERRLA, EOMI, no pallor, no icterus Neck- no cervical lymphadenopathy Cardiovascular- normal s1,s2, no murmur, no leg edema Respiratory- bilateral clear to auscultation, no wheeze, no rhonchi, no crackles, no use of accessory muscles Abdomen- bowel sounds present, soft, non tender Musculoskeletal- in bed all the time, under total care with her ADLs, PROM to all extremities and to UE shows some resistance with  contracture to right shoulder Neurological- unable to assess Skin- warm and dry   Labs reviewed: Basic Metabolic Panel:  Recent Labs  16/10/96 11/09/15  NA 142 140  K 3.8 4.1  BUN 10 14  CREATININE 0.8 0.8   Liver Function Tests:  Recent Labs  11/02/15  AST 26  ALT 42*  ALKPHOS 60   No results for input(s): LIPASE, AMYLASE in the last 8760 hours. No results for input(s): AMMONIA in the last 8760 hours. CBC:  Recent Labs  11/02/15 11/09/15  WBC 6.2 6.6  HGB 15.6 16.3*  HCT 47* 50*  PLT 330  --      Assessment/Plan  gerd Stable. Continue ranitidine 75 mg daily at bedtime  HTN No BP reading for review. Continue amlodipine 5 mg daily and hctz 12.5 mg daily. Check bp daily for now  History of left DVT On apixaban 5 mg daily. Per admission note at other facility from 11/04/14, pt to be on apixaban for 3 month. Will need to review her prior records to assess for need of continuation of apixaban. No known diagnosis of afib or stroke on available record.  Hyperlipidemia Lipid Panel     Component Value Date/Time   CHOL 211 (A) 11/02/2015   TRIG 101 11/02/2015   HDL 45 11/02/2015   LDLCALC 146 11/02/2015   Has been started on simvastatin 10 mg daily for hyperlipidemia to decrease cardiovascular risk. Monitor LFT and lipid panel   Family/ staff Communication: reviewed care plan with patient and nursing supervisor    Oneal Grout, MD Internal Medicine [facility] [address] [place], [place] [address] Cell Phone (Monday-Friday 8 am - 5 pm): [phone] On Call: [phone] and follow prompts after 5 pm and on weekends Office Phone: [phone] Office Fax: [phone]

## 2015-12-06 ENCOUNTER — Other Ambulatory Visit: Payer: Self-pay | Admitting: *Deleted

## 2015-12-06 MED ORDER — HYDROCODONE-ACETAMINOPHEN 5-325 MG PO TABS: ORAL_TABLET | ORAL | 0 refills | Status: DC

## 2015-12-06 NOTE — Telephone Encounter (Signed)
[facility] [phone] Fax: [phone]  

## 2015-12-15 ENCOUNTER — Encounter (HOSPITAL_COMMUNITY): Payer: Self-pay | Admitting: Emergency Medicine

## 2015-12-15 ENCOUNTER — Inpatient Hospital Stay (HOSPITAL_COMMUNITY)
Admission: EM | Admit: 2015-12-15 | Discharge: 2015-12-25 | DRG: 871 | Disposition: A | Discharge status: Hospice | Payer: Medicare Other | Attending: Internal Medicine | Admitting: Internal Medicine

## 2015-12-15 ENCOUNTER — Inpatient Hospital Stay (HOSPITAL_COMMUNITY): Payer: Medicare Other

## 2015-12-15 ENCOUNTER — Emergency Department (HOSPITAL_COMMUNITY): Payer: Medicare Other

## 2015-12-15 DIAGNOSIS — Z515 Encounter for palliative care: Secondary | ICD-10-CM | POA: Diagnosis present

## 2015-12-15 DIAGNOSIS — Y95 Nosocomial condition: Secondary | ICD-10-CM | POA: Diagnosis present

## 2015-12-15 DIAGNOSIS — Z7901 Long term (current) use of anticoagulants: Secondary | ICD-10-CM

## 2015-12-15 DIAGNOSIS — L89152 Pressure ulcer of sacral region, stage 2: Secondary | ICD-10-CM | POA: Diagnosis present

## 2015-12-15 DIAGNOSIS — F0280 Dementia in other diseases classified elsewhere without behavioral disturbance: Secondary | ICD-10-CM

## 2015-12-15 DIAGNOSIS — G9340 Encephalopathy, unspecified: Secondary | ICD-10-CM

## 2015-12-15 DIAGNOSIS — J690 Pneumonitis due to inhalation of food and vomit: Secondary | ICD-10-CM | POA: Diagnosis present

## 2015-12-15 DIAGNOSIS — Z8719 Personal history of other diseases of the digestive system: Secondary | ICD-10-CM

## 2015-12-15 DIAGNOSIS — I10 Essential (primary) hypertension: Secondary | ICD-10-CM | POA: Diagnosis present

## 2015-12-15 DIAGNOSIS — E119 Type 2 diabetes mellitus without complications: Secondary | ICD-10-CM | POA: Diagnosis present

## 2015-12-15 DIAGNOSIS — E785 Hyperlipidemia, unspecified: Secondary | ICD-10-CM | POA: Diagnosis present

## 2015-12-15 DIAGNOSIS — R6520 Severe sepsis without septic shock: Secondary | ICD-10-CM | POA: Diagnosis present

## 2015-12-15 DIAGNOSIS — A419 Sepsis, unspecified organism: Principal | ICD-10-CM | POA: Diagnosis present

## 2015-12-15 DIAGNOSIS — E860 Dehydration: Secondary | ICD-10-CM | POA: Diagnosis present

## 2015-12-15 DIAGNOSIS — J189 Pneumonia, unspecified organism: Secondary | ICD-10-CM | POA: Diagnosis present

## 2015-12-15 DIAGNOSIS — G9341 Metabolic encephalopathy: Secondary | ICD-10-CM | POA: Diagnosis present

## 2015-12-15 DIAGNOSIS — L8990 Pressure ulcer of unspecified site, unspecified stage: Secondary | ICD-10-CM | POA: Insufficient documentation

## 2015-12-15 DIAGNOSIS — Z66 Do not resuscitate: Secondary | ICD-10-CM | POA: Diagnosis present

## 2015-12-15 DIAGNOSIS — Z79899 Other long term (current) drug therapy: Secondary | ICD-10-CM

## 2015-12-15 DIAGNOSIS — K047 Periapical abscess without sinus: Secondary | ICD-10-CM | POA: Diagnosis present

## 2015-12-15 DIAGNOSIS — N390 Urinary tract infection, site not specified: Secondary | ICD-10-CM | POA: Diagnosis present

## 2015-12-15 DIAGNOSIS — Z880 Allergy status to penicillin: Secondary | ICD-10-CM

## 2015-12-15 DIAGNOSIS — G3109 Other frontotemporal dementia: Secondary | ICD-10-CM | POA: Diagnosis not present

## 2015-12-15 DIAGNOSIS — Z7189 Other specified counseling: Secondary | ICD-10-CM | POA: Diagnosis not present

## 2015-12-15 DIAGNOSIS — R1319 Other dysphagia: Secondary | ICD-10-CM | POA: Diagnosis present

## 2015-12-15 DIAGNOSIS — L89622 Pressure ulcer of left heel, stage 2: Secondary | ICD-10-CM | POA: Diagnosis present

## 2015-12-15 DIAGNOSIS — R4182 Altered mental status, unspecified: Secondary | ICD-10-CM | POA: Diagnosis present

## 2015-12-15 DIAGNOSIS — I248 Other forms of acute ischemic heart disease: Secondary | ICD-10-CM | POA: Diagnosis not present

## 2015-12-15 DIAGNOSIS — Z9049 Acquired absence of other specified parts of digestive tract: Secondary | ICD-10-CM

## 2015-12-15 DIAGNOSIS — N179 Acute kidney failure, unspecified: Secondary | ICD-10-CM

## 2015-12-15 DIAGNOSIS — E870 Hyperosmolality and hypernatremia: Secondary | ICD-10-CM | POA: Diagnosis present

## 2015-12-15 DIAGNOSIS — E876 Hypokalemia: Secondary | ICD-10-CM | POA: Diagnosis present

## 2015-12-15 DIAGNOSIS — E878 Other disorders of electrolyte and fluid balance, not elsewhere classified: Secondary | ICD-10-CM | POA: Diagnosis present

## 2015-12-15 DIAGNOSIS — E872 Acidosis, unspecified: Secondary | ICD-10-CM

## 2015-12-15 DIAGNOSIS — T801XXA Vascular complications following infusion, transfusion and therapeutic injection, initial encounter: Secondary | ICD-10-CM

## 2015-12-15 DIAGNOSIS — I69351 Hemiplegia and hemiparesis following cerebral infarction affecting right dominant side: Secondary | ICD-10-CM

## 2015-12-15 DIAGNOSIS — Z86718 Personal history of other venous thrombosis and embolism: Secondary | ICD-10-CM

## 2015-12-15 DIAGNOSIS — R7889 Finding of other specified substances, not normally found in blood: Secondary | ICD-10-CM | POA: Diagnosis not present

## 2015-12-15 DIAGNOSIS — J9601 Acute respiratory failure with hypoxia: Secondary | ICD-10-CM | POA: Diagnosis present

## 2015-12-15 DIAGNOSIS — D638 Anemia in other chronic diseases classified elsewhere: Secondary | ICD-10-CM | POA: Diagnosis present

## 2015-12-15 DIAGNOSIS — R7989 Other specified abnormal findings of blood chemistry: Secondary | ICD-10-CM | POA: Diagnosis not present

## 2015-12-15 DIAGNOSIS — Z8249 Family history of ischemic heart disease and other diseases of the circulatory system: Secondary | ICD-10-CM

## 2015-12-15 LAB — URINE MICROSCOPIC-ADD ON

## 2015-12-15 LAB — I-STAT CG4 LACTIC ACID, ED
Lactic Acid, Venous: 6.75 mmol/L (ref 0.5–1.9)
Lactic Acid, Venous: 9.31 mmol/L (ref 0.5–1.9)

## 2015-12-15 LAB — I-STAT ARTERIAL BLOOD GAS, ED
ACID-BASE EXCESS: 1 mmol/L (ref 0.0–2.0)
BICARBONATE: 24.5 mmol/L (ref 20.0–28.0)
O2 Saturation: 94 %
PH ART: 7.454 — AB (ref 7.350–7.450)
TCO2: 25 mmol/L (ref 0–100)
pCO2 arterial: 35.4 mmHg (ref 32.0–48.0)
pO2, Arterial: 75 mmHg — ABNORMAL LOW (ref 83.0–108.0)

## 2015-12-15 LAB — CBC WITH DIFFERENTIAL/PLATELET
BASOS ABS: 0 10*3/uL (ref 0.0–0.1)
BASOS PCT: 0 %
EOS ABS: 0 10*3/uL (ref 0.0–0.7)
EOS PCT: 0 %
HCT: 55.5 % — ABNORMAL HIGH (ref 36.0–46.0)
Hemoglobin: 17.8 g/dL — ABNORMAL HIGH (ref 12.0–15.0)
Lymphocytes Relative: 9 %
Lymphs Abs: 1.5 10*3/uL (ref 0.7–4.0)
MCH: 32.4 pg (ref 26.0–34.0)
MCHC: 32.1 g/dL (ref 30.0–36.0)
MCV: 101.1 fL — ABNORMAL HIGH (ref 78.0–100.0)
MONO ABS: 0.5 10*3/uL (ref 0.1–1.0)
MONOS PCT: 3 %
NEUTROS ABS: 14.2 10*3/uL — AB (ref 1.7–7.7)
Neutrophils Relative %: 88 %
PLATELETS: 284 10*3/uL (ref 150–400)
RBC: 5.49 MIL/uL — ABNORMAL HIGH (ref 3.87–5.11)
RDW: 13.8 % (ref 11.5–15.5)
WBC: 16.2 10*3/uL — ABNORMAL HIGH (ref 4.0–10.5)

## 2015-12-15 LAB — COMPREHENSIVE METABOLIC PANEL
ALBUMIN: 3.9 g/dL (ref 3.5–5.0)
ALK PHOS: 77 U/L (ref 38–126)
ALT: 156 U/L — AB (ref 14–54)
ANION GAP: 22 — AB (ref 5–15)
AST: 112 U/L — ABNORMAL HIGH (ref 15–41)
BILIRUBIN TOTAL: 3.8 mg/dL — AB (ref 0.3–1.2)
BUN: 94 mg/dL — ABNORMAL HIGH (ref 6–20)
CALCIUM: 9.3 mg/dL (ref 8.9–10.3)
CO2: 25 mmol/L (ref 22–32)
CREATININE: 4.67 mg/dL — AB (ref 0.44–1.00)
Chloride: 120 mmol/L — ABNORMAL HIGH (ref 101–111)
GFR calc Af Amer: 11 mL/min — ABNORMAL LOW (ref >60)
GFR calc non Af Amer: 9 mL/min — ABNORMAL LOW (ref >60)
GLUCOSE: 224 mg/dL — AB (ref 65–99)
Potassium: 2.7 mmol/L — CL (ref 3.5–5.1)
SODIUM: 167 mmol/L — AB (ref 135–145)
TOTAL PROTEIN: 7.8 g/dL (ref 6.5–8.1)

## 2015-12-15 LAB — BRAIN NATRIURETIC PEPTIDE: B Natriuretic Peptide: 272.2 pg/mL — ABNORMAL HIGH (ref 0.0–100.0)

## 2015-12-15 LAB — URINALYSIS, ROUTINE W REFLEX MICROSCOPIC
Glucose, UA: NEGATIVE mg/dL
KETONES UR: NEGATIVE mg/dL
NITRITE: NEGATIVE
PH: 5 (ref 5.0–8.0)
PROTEIN: NEGATIVE mg/dL
Specific Gravity, Urine: 1.013 (ref 1.005–1.030)

## 2015-12-15 LAB — I-STAT TROPONIN, ED: TROPONIN I, POC: 1.95 ng/mL — AB (ref 0.00–0.08)

## 2015-12-15 MED ORDER — SODIUM CHLORIDE 0.9 % IV BOLUS (SEPSIS)
2400.0000 mL | Freq: Once | INTRAVENOUS | Status: AC
  Administered 2015-12-15: 2400 mL via INTRAVENOUS

## 2015-12-15 NOTE — ED Notes (Signed)
Dr. Fayrene FearingJames was notified about all critical lab results (KT)..Marland Kitchen

## 2015-12-15 NOTE — ED Notes (Signed)
Dr. Fayrene FearingJames notified of critical lab values. NA - 167 and K- 2.7

## 2015-12-15 NOTE — ED Notes (Signed)
Pt at CT

## 2015-12-15 NOTE — Consult Note (Signed)
Chief Complaint/Reason for Consult: elevated troponin   Requesting Physician: Dr. 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  On apixabanfor previous DVT.  No known CAD history.  Was brought in to the ED due to mental status changes.  Non verbal and non ambulatory at baseline.  Was febrile with shortness of breath recently.  Decreased urine output.  Presents to the ED febrile, tachycardic, septic.    Labs show septic picture with multiorgan failure and lactic acidosis (lactate 9) and renal failure.  Na 167.  CRt 4.7.  WBC 16, HGB 18.  No overt chest pain but history limited.  Troponin elevated on admission to 1.95 prompting consult.  Discussed patient with her sister, again, no thoughts of chest discomfort recently.    Past Medical History:  Diagnosis Date  . Dementia     Past Surgical History:  Procedure Laterality Date  . CHOLECYSTECTOMY      History reviewed. No pertinent family history. Limited by mental status.   Social History:  reports that she has never smoked. She has never used smokeless tobacco. She reports that she does not drink alcohol. Her drug history is not on file.  Allergies:  Allergies  Allergen Reactions  . Penicillins Hives and Anaphylaxis    No current facility-administered medications on file prior to encounter.    Current Outpatient Prescriptions on File Prior to Encounter  Medication Sig Dispense Refill  . amLODipine (NORVASC) 5 MG tablet Take 5 mg by mouth daily.    Marland Kitchen apixaban (ELIQUIS) 5 MG TABS tablet Take 5 mg by mouth daily.    . bisacodyl (DULCOLAX) 10 MG suppository Place 10 mg rectally daily as needed for moderate constipation.    . capsaicin (ZOSTRIX) 0.025 % cream Apply 1 application topically 3 (three) times daily. Apply thin layer to bilateral knees    . hydrochlorothiazide (MICROZIDE) 12.5 MG capsule Take 12.5 mg by  mouth daily.    Marland Kitchen HYDROcodone-acetaminophen (NORCO/VICODIN) 5-325 MG tablet Take one tablet by mouth every six hours for pain; Take 1/2 tablet by mouth twice daily as needed for breakthrough pain. Do not exceed 3gm of Tylenol in 24 hours 150 tablet 0  . Hypromellose (ARTIFICIAL TEARS OP) Apply 1 drop to eye 4 (four) times daily as needed.    . Melatonin 3 MG TABS Take 1 tablet by mouth at bedtime.    . memantine (NAMENDA) 10 MG tablet Take 30 mg by mouth 2 (two) times daily.    . ondansetron (ZOFRAN) 4 MG tablet Take 4 mg by mouth every 8 (eight) hours as needed for nausea or vomiting.    . polyethylene glycol (MIRALAX / GLYCOLAX) packet Take 17 g by mouth at bedtime.    . ranitidine (ZANTAC) 75 MG tablet Take 75 mg by mouth at bedtime.    . senna (SENOKOT) 8.6 MG tablet Take 1 tablet by mouth as needed for constipation.    . simvastatin (ZOCOR) 10 MG tablet Take 10 mg by mouth daily.    . traZODone (DESYREL) 100 MG tablet Take 100 mg by mouth at bedtime.       Results for orders placed or performed during the hospital encounter of 12/15/15 (from the past 48 hour(s))  Comprehensive metabolic panel     Status: Abnormal   Collection Time: 12/15/15  8:32 PM  Result Value Ref Range   Sodium 167 (  HH) 135 - 145 mmol/L    Comment: CRITICAL RESULT CALLED TO, READ BACK BY AND VERIFIED WITH: Jannette Spanner RN [id number] GREEN R    Potassium 2.7 (LL) 3.5 - 5.1 mmol/L    Comment: CRITICAL RESULT CALLED TO, READ BACK BY AND VERIFIED WITH: Jannette Spanner RN [id number] [phone] GREEN R    Chloride 120 (H) 101 - 111 mmol/L   CO2 25 22 - 32 mmol/L   Glucose, Bld 224 (H) 65 - 99 mg/dL   BUN 94 (H) 6 - 20 mg/dL   Creatinine, Ser 4.67 (H) 0.44 - 1.00 mg/dL   Calcium 9.3 8.9 - 10.3 mg/dL   Total Protein 7.8 6.5 - 8.1 g/dL   Albumin 3.9 3.5 - 5.0 g/dL   AST 112 (H) 15 - 41 U/L   ALT 156 (H) 14 - 54 U/L    Comment: RESULTS CONFIRMED BY MANUAL DILUTION   Alkaline Phosphatase 77 38 - 126 U/L   Total Bilirubin 3.8 (H) 0.3  - 1.2 mg/dL   GFR calc non Af Amer 9 (L) >60 mL/min   GFR calc Af Amer 11 (L) >60 mL/min    Comment: (NOTE) The eGFR has been calculated using the CKD EPI equation. This calculation has not been validated in all clinical situations. eGFR's persistently <60 mL/min signify possible Chronic Kidney Disease. CORRECTED ON 11/03 AT 2153: PREVIOUSLY REPORTED AS 11    Anion gap 22 (H) 5 - 15  CBC WITH DIFFERENTIAL     Status: Abnormal   Collection Time: 12/15/15  8:32 PM  Result Value Ref Range   WBC 16.2 (H) 4.0 - 10.5 K/uL   RBC 5.49 (H) 3.87 - 5.11 MIL/uL   Hemoglobin 17.8 (H) 12.0 - 15.0 g/dL   HCT 55.5 (H) 36.0 - 46.0 %    Comment: REPEATED TO VERIFY   MCV 101.1 (H) 78.0 - 100.0 fL   MCH 32.4 26.0 - 34.0 pg   MCHC 32.1 30.0 - 36.0 g/dL   RDW 13.8 11.5 - 15.5 %   Platelets 284 150 - 400 K/uL   Neutrophils Relative % 88 %   Neutro Abs 14.2 (H) 1.7 - 7.7 K/uL   Lymphocytes Relative 9 %   Lymphs Abs 1.5 0.7 - 4.0 K/uL   Monocytes Relative 3 %   Monocytes Absolute 0.5 0.1 - 1.0 K/uL   Eosinophils Relative 0 %   Eosinophils Absolute 0.0 0.0 - 0.7 K/uL   Basophils Relative 0 %   Basophils Absolute 0.0 0.0 - 0.1 K/uL  Brain natriuretic peptide     Status: Abnormal   Collection Time: 12/15/15  8:32 PM  Result Value Ref Range   B Natriuretic Peptide 272.2 (H) 0.0 - 100.0 pg/mL  I-stat troponin, ED (not at [facility])     Status: Abnormal   Collection Time: 12/15/15  8:51 PM  Result Value Ref Range   Troponin i, poc 1.95 (HH) 0.00 - 0.08 ng/mL   Comment NOTIFIED PHYSICIAN    Comment 3            Comment: Due to the release kinetics of cTnI, a negative result within the first hours of the onset of symptoms does not rule out myocardial infarction with certainty. If myocardial infarction is still suspected, repeat the test at appropriate intervals.   I-Stat CG4 Lactic Acid, ED  (not at  [facility])     Status: Abnormal   Collection Time: 12/15/15  8:53 PM  Result Value Ref Range  Lactic  Acid, Venous 6.75 (HH) 0.5 - 1.9 mmol/L   Comment NOTIFIED PHYSICIAN   I-Stat arterial blood gas, ED     Status: Abnormal   Collection Time: 12/15/15  9:10 PM  Result Value Ref Range   pH, Arterial 7.454 (H) 7.350 - 7.450   pCO2 arterial 35.4 32.0 - 48.0 mmHg   pO2, Arterial 75.0 (L) 83.0 - 108.0 mmHg   Bicarbonate 24.5 20.0 - 28.0 mmol/L   TCO2 25 0 - 100 mmol/L   O2 Saturation 94.0 %   Acid-Base Excess 1.0 0.0 - 2.0 mmol/L   Patient temperature 101.7 F    Collection site RADIAL, ALLEN'S TEST ACCEPTABLE    Drawn by RT    Sample type ARTERIAL   Urinalysis, Routine w reflex microscopic (not at [facility])     Status: Abnormal   Collection Time: 12/15/15 10:16 PM  Result Value Ref Range   Color, Urine AMBER (A) YELLOW    Comment: BIOCHEMICALS MAY BE AFFECTED BY COLOR   APPearance CLOUDY (A) CLEAR   Specific Gravity, Urine 1.013 1.005 - 1.030   pH 5.0 5.0 - 8.0   Glucose, UA NEGATIVE NEGATIVE mg/dL   Hgb urine dipstick LARGE (A) NEGATIVE   Bilirubin Urine SMALL (A) NEGATIVE   Ketones, ur NEGATIVE NEGATIVE mg/dL   Protein, ur NEGATIVE NEGATIVE mg/dL   Nitrite NEGATIVE NEGATIVE   Leukocytes, UA TRACE (A) NEGATIVE  Urine microscopic-add on     Status: Abnormal   Collection Time: 12/15/15 10:16 PM  Result Value Ref Range   Squamous Epithelial / LPF 0-5 (A) NONE SEEN   WBC, UA 6-30 0 - 5 WBC/hpf   RBC / HPF TOO NUMEROUS TO COUNT 0 - 5 RBC/hpf   Bacteria, UA FEW (A) NONE SEEN   Casts HYALINE CASTS (A) NEGATIVE   Urine-Other MUCOUS PRESENT   I-Stat CG4 Lactic Acid, ED  (not at  [facility])     Status: Abnormal   Collection Time: 12/15/15 11:37 PM  Result Value Ref Range   Lactic Acid, Venous 9.31 (HH) 0.5 - 1.9 mmol/L   Comment NOTIFIED PHYSICIAN    Dg Chest Port 1 View  Result Date: 12/15/2015 CLINICAL DATA:  Sepsis EXAM: PORTABLE CHEST 1 VIEW COMPARISON:  09/08/2012 FINDINGS: Small amount of left basilar atelectasis versus mild medial infiltrate. No effusion. Heart size normal. No  pneumothorax. IMPRESSION: Mild medial left base opacity could relate to mild atelectasis versus small infiltrate. Electronically Signed   By: Donavan Foil M.D.   On: 12/15/2015 21:06    ECG/TELE: sinus tachycardia with borderline LAD  ROS: As above. Otherwise, review of systems is negative unless per above HPI  Vitals:   12/15/15 2200 12/15/15 2215 12/15/15 2230 12/15/15 2300  BP: (!) 158/107 155/90 115/81 150/97  Pulse: 93 89 86 108  Resp: (!) 35 _0 Temp:      TempSrc:      SpO2: 95% 100% 98% 98%  Weight:      Height:       Wt Readings from Last 10 Encounters:  12/15/15 81.6 kg (180 lb)  11/30/15 79.9 kg (176 lb 3.2 oz)  11/06/15 84.8 kg (187 lb)    PE:  General: No acute distress, non verbal HEENT: Atraumatic, EOMI, mucous membranes dry CV: RRR no murmurs, gallops. No JVD at 0 degrees. No HJR. Respiratory: Clear, no crackles. Normal work of breathing ABD: Non-distended and non-tender. No palpable organomegaly.  Extremities: 2+ radial pulses bilaterally. no edema. Neuro/Psych: CN  grossly intact, alert and oriented  Assessment/Plan Sepsis Renal failure Lactic acidosis Hypernatremia Elevated troponin HLD DVT on apixaban  Elevated Troponin- ECG negative for ischemia.  Given clinical presentation, likely reflects type II injury from supply demand mismatch and / or direct injury from sepsis and other significant metabolic derangements.      Recommendations - Whenever safe from a bleeding standpoint, provide 325 ASA and continue 81 mg ASA - Risk of bleeding likely outweigh benefits for heparin at this time given outpatient apixaban and renal injury - TTE - trend troponin - daily ECG - Restart home statin when safe from renal function standpoint - Further recommendations pending clinical course and troponin trend but given her overall prognosis and clear other culprit for altered mental status, favor a conservative approach.   Lolita Cram Vaudie Engebretsen   MD 12/15/2015, 11:50 PM

## 2015-12-15 NOTE — ED Provider Notes (Signed)
[facility]-EMERGENCY DEPT Provider Note   CSN: [id number] Arrival date & time: 12/15/15  1953     History   Chief Complaint Chief Complaint  Patient presents with  . Altered Mental Status    HPI Janet Griffin is a 65 y.o. female. She presents with fever and rapid breathing.  She has an unfortunate history of early onset Alzheimer's. She is at a skilled nursing facility. She has become nonverbal and minimal to nonambulatory in the last year.  Majority of her history is obtained from her sister who sees her almost daily. She states that she is eaten little for the last 4-5 days has had progressive decline. Feverish yesterday.  Had an order for subcutaneous fluids and was given 500 mL of fluids via SQ Clysis at 60cc/hr for ? Hours, started today.   Normal chest x-ray yesterday. Given IM Rocephin, and I am Lasix as her urine output had dropped off. Given a breathing treatment. Symptoms persisted today and she was referred here via ambulance.  Presents here with tachycardia, tachypnea, temperature 103.  HPI  Past Medical History:  Diagnosis Date  . Dementia     Patient Active Problem List   Diagnosis Date Noted  . Pure hypercholesterolemia 11/30/2015  . GERD without esophagitis 11/30/2015  . Essential hypertension 11/30/2015  . History of DVT of lower extremity 11/30/2015    Past Surgical History:  Procedure Laterality Date  . CHOLECYSTECTOMY      OB History    No data available       Home Medications    Prior to Admission medications   Medication Sig Start Date End Date Taking? Authorizing Provider  amLODipine (NORVASC) 5 MG tablet Take 5 mg by mouth daily.    Historical Provider, MD  apixaban (ELIQUIS) 5 MG TABS tablet Take 5 mg by mouth daily.    Historical Provider, MD  bisacodyl (DULCOLAX) 10 MG suppository Place 10 mg rectally daily as needed for moderate constipation.    Historical Provider, MD  capsaicin (ZOSTRIX) 0.025 % cream Apply 1 application topically  3 (three) times daily. Apply thin layer to bilateral knees    Historical Provider, MD  hydrochlorothiazide (MICROZIDE) 12.5 MG capsule Take 12.5 mg by mouth daily.    Historical Provider, MD  HYDROcodone-acetaminophen (NORCO/VICODIN) 5-325 MG tablet Take one tablet by mouth every six hours for pain; Take 1/2 tablet by mouth twice daily as needed for breakthrough pain. Do not exceed 3gm of Tylenol in 24 hours 12/06/15   Kirt Boys, DO  Hypromellose (ARTIFICIAL TEARS OP) Apply 1 drop to eye 4 (four) times daily as needed.    Historical Provider, MD  Melatonin 3 MG TABS Take 1 tablet by mouth at bedtime.    Historical Provider, MD  memantine (NAMENDA) 10 MG tablet Take 30 mg by mouth 2 (two) times daily.    Historical Provider, MD  ondansetron (ZOFRAN) 4 MG tablet Take 4 mg by mouth every 8 (eight) hours as needed for nausea or vomiting.    Historical Provider, MD  polyethylene glycol (MIRALAX / GLYCOLAX) packet Take 17 g by mouth at bedtime.    Historical Provider, MD  ranitidine (ZANTAC) 75 MG tablet Take 75 mg by mouth at bedtime.    Historical Provider, MD  senna (SENOKOT) 8.6 MG tablet Take 1 tablet by mouth as needed for constipation.    Historical Provider, MD  simvastatin (ZOCOR) 10 MG tablet Take 10 mg by mouth daily.    Historical Provider, MD  traZODone (DESYREL) 100  MG tablet Take 100 mg by mouth at bedtime.    Historical Provider, MD    Family History History reviewed. No pertinent family history.  Social History Social History  Substance Use Topics  . Smoking status: Never Smoker  . Smokeless tobacco: Never Used  . Alcohol use No     Allergies   Penicillins   Review of Systems Review of Systems  Unable to perform ROS: Patient nonverbal     Physical Exam Updated Vital Signs BP 155/90   Pulse 89   Temp 101.7 F (38.7 C) (Rectal)   Resp 26   Ht 5\' 7"  (1.702 m)   Wt 180 lb (81.6 kg)   SpO2 100%   BMI 28.19 kg/m   Physical Exam  Constitutional: She appears  well-developed and well-nourished. She appears listless. She has a sickly appearance. She appears ill. No distress.  HENT:  Head: Normocephalic.  Mouth/Throat:    Eyes: Conjunctivae are normal. Pupils are equal, round, and reactive to light. No scleral icterus.  Conjunctiva not pale. Eyes do not appear sunken.  Neck: Normal range of motion. Neck supple. No thyromegaly present.  Marland Kitchen. No JVD. Supple neck  Cardiovascular: Normal rate and regular rhythm.  Exam reveals no gallop and no friction rub.   No murmur heard. Sinus tachycardia 130.  BP 147/73  Pulmonary/Chest: Effort normal and breath sounds normal. No respiratory distress. She has no wheezes. She has no rales.  Tachypnea. No increased work of breathing. Clear lungs.  Abdominal: Soft. Bowel sounds are normal. She exhibits no distension. There is no tenderness. There is no rebound.  Soft abdomen.  Musculoskeletal: Normal range of motion.  Neurological: She appears listless.  Eyes open. Nonverbal. Moves all 4 extremities.  Skin: Skin is warm and dry. No rash noted.  Erythema over the sacrum.   Psychiatric: She has a normal mood and affect. Her behavior is normal.     ED Treatments / Results  Labs (all labs ordered are listed, but only abnormal results are displayed) Labs Reviewed  COMPREHENSIVE METABOLIC PANEL - Abnormal; Notable for the following:       Result Value   Sodium 167 (*)    Potassium 2.7 (*)    Chloride 120 (*)    Glucose, Bld 224 (*)    BUN 94 (*)    Creatinine, Ser 4.67 (*)    AST 112 (*)    ALT 156 (*)    Total Bilirubin 3.8 (*)    GFR calc non Af Amer 9 (*)    GFR calc Af Amer 11 (*)    Anion gap 22 (*)    All other components within normal limits  CBC WITH DIFFERENTIAL/PLATELET - Abnormal; Notable for the following:    WBC 16.2 (*)    RBC 5.49 (*)    Hemoglobin 17.8 (*)    HCT 55.5 (*)    MCV 101.1 (*)    Neutro Abs 14.2 (*)    All other components within normal limits  I-STAT CG4 LACTIC ACID, ED  - Abnormal; Notable for the following:    Lactic Acid, Venous 6.75 (*)    All other components within normal limits  I-STAT TROPOININ, ED - Abnormal; Notable for the following:    Troponin i, poc 1.95 (*)    All other components within normal limits  I-STAT ARTERIAL BLOOD GAS, ED - Abnormal; Notable for the following:    pH, Arterial 7.454 (*)    pO2, Arterial 75.0 (*)  All other components within normal limits  CULTURE, BLOOD (ROUTINE X 2)  CULTURE, BLOOD (ROUTINE X 2)  URINE CULTURE  URINALYSIS, ROUTINE W REFLEX MICROSCOPIC (NOT AT [facility])  BRAIN NATRIURETIC PEPTIDE  BLOOD GAS, ARTERIAL  INFLUENZA PANEL BY PCR (TYPE A & B, H1N1)  I-STAT CG4 LACTIC ACID, ED    EKG  EKG Interpretation  Date/Time:  Friday December 15 2015 20:02:17 EDT Ventricular Rate:  104 PR Interval:    QRS Duration: 75 QT Interval:  385 QTC Calculation: 507 R Axis:   -35 Text Interpretation:  Sinus tachycardia Left axis deviation Prolonged QT interval Confirmed by Fayrene FearingJAMES  MD, Cigi Bega ([id number]) on 12/15/2015 9:08:25 PM       Radiology Dg Chest Port 1 View  Result Date: 12/15/2015 CLINICAL DATA:  Sepsis EXAM: PORTABLE CHEST 1 VIEW COMPARISON:  09/08/2012 FINDINGS: Small amount of left basilar atelectasis versus mild medial infiltrate. No effusion. Heart size normal. No pneumothorax. IMPRESSION: Mild medial left base opacity could relate to mild atelectasis versus small infiltrate. Electronically Signed   By: Jasmine PangKim  Fujinaga M.D.   On: 12/15/2015 21:06    Procedures Procedures (including critical care time)  Medications Ordered in ED Medications  sodium chloride 0.9 % bolus 2,400 mL (2,400 mLs Intravenous New Bag/Given 12/15/15 2040)   Angiocath insertion Performed by: Claudean KindsJAMES, Lasasha Brophy JOSEPH  Consent: Verbal consent obtained. Risks and benefits: risks, benefits and alternatives were discussed Time out: Immediately prior to procedure a "time out" was called to verify the correct patient, procedure, equipment,  support staff and site/side marked as required.  Preparation: Patient was prepped and draped in the usual sterile fashion.  Vein Location: Left AC   +Ultrasound Guided  Gauge: 20  Normal blood return and flush without difficulty Patient tolerance: Patient tolerated the procedure well with no immediate complications.     Initial Impression / Assessment and Plan / ED Course  I have reviewed the triage vital signs and the nursing notes.  Pertinent labs & imaging results that were available during my care of the patient were reviewed by me and considered in my medical decision making (see chart for details).  Clinical Course    IV access obtained by myself. Cultures and studies obtained. Initial cath urine produces urine. Apparently the container fractured in route to lab and no specimens being obtained currently. Given vancomycin and Maxipime. Chest x-ray does not show infiltrate or abnormalities. EKG shows sinus tach but no acute or ischemic changes. No ST changes. No T wave changes.   Lab abnormalities. Current problem list includes Sepsis, etiology yet to be determined Acute kidney injury Elevated troponin in face of normal EKG. Leukocytosis Hypokalemia Hypernatremia with sodium 167. Bilirubin 3.8.     Final Clinical Impressions(s) / ED Diagnoses   Final diagnoses:  None   Not hypotensive here. Unknown if patient was hypotensive over last few days. Patient clinically looked much improved after IV fluids. Rest her rate is decreased shows 50-60 down to 28. Heart rate at 94. Systolic pressure 1:30. Perfusing well. Urine collected. His been given antibiotics. Discussed with Dr. Montez Moritaarter of [facility] hospitalist. Patient be admitted to a stepdown bed.  CRITICAL CARE Performed by: Rolland PorterJAMES, Vergene Marland JOSEPH   Total critical care time: 60 minutes  Critical care time was exclusive of separately billable procedures and treating other patients.  Critical care was necessary to treat or  prevent imminent or life-threatening deterioration.  Critical care was time spent personally by me on the following activities: development of treatment plan  with patient and/or surrogate as well as nursing, discussions with consultants, evaluation of patient's response to treatment, examination of patient, obtaining history from patient or surrogate, ordering and performing treatments and interventions, ordering and review of laboratory studies, ordering and review of radiographic studies, pulse oximetry and re-evaluation of patient's condition.    New Prescriptions New Prescriptions   No medications on file     Rolland Porter, MD 12/15/15 2239

## 2015-12-15 NOTE — ED Notes (Signed)
I-stat lactic ran at 2336 and results came back elevated. Blood and requisition forms for this blood was left on mini lab desk by previous staff. Results given to MD and nurse notified. Per nurse she will do repeat I-stat lactic for assurance.

## 2015-12-15 NOTE — ED Notes (Addendum)
edp at bedside attempting iv access with [place]

## 2015-12-15 NOTE — H&P (Signed)
History and Physical    Janet Griffin:[id number] DOB: 15-Apr-1950 DOA: 12/15/2015  PCP: Keane Police, MD   Patient coming from: SNF  Chief Complaint: Altered mental status, increased lethargy, decreased PO intake for the past two days  HPI: Janet Griffin is a 65 y.o. woman with a history of CVA with right sided weakness and dementia (she is nonverbal at baseline) who is a long term resident of a SNF.  She has been transferred to the ED for evaluation of mental status changes including increased lethargy (sleeping more), decreased PO intake, decreased eye contact with family.  She cannot verbalize specific pain complaints.  Her sister reports a history of a tooth abscess, for which the patient has completed a course of oral antibiotics but the tooth has not been pulled.  Fever was documented prior to presentation.  She has had cough and increased wheezing per her sister.  The facility has reported decreased urine output, and her sister feels that the urine has an increased odor.  Of note, she is also XARELTO for a history of LLE DVT.  ED Course: Multiple lab abnormalities identified and the patient has received volume resuscitation 30cc/kg per sepsis protocol.  Lactic acid level 6.75, then 9.3, then 5.2.  EKG shows a sinus tachycardia without acute ST changes but her initial troponin is 1.95.  She has AKI with BUN 94 and Creatinine of 4.67.  Chest xray is not conclusive for pneumonia.  U/A consistent with infection with 6-30 WBC, TNTC RBC, few bacteria, and mucous.  She has acute hypoxic respiratory failure with a pO2 on her ABG of 75 (taken on 100% NRB).  The patient was breathing almost 60 times per minute upon arrival, but this has improved after volume resuscitation.  She has a WBC count of 16.2, Hgb 17.8, sodium of 167, Potassium of 2.7, total bilirubin of 3.8, AST 112, ALT 156.  Case discussed with PCCM.  Admission deferred to Hospitalist.  Patient to be admitted to the  stepdown unit.  Review of Systems: Unable to obtain; patient is nonverbal at baseline.   Past Medical History:  Diagnosis Date  . Dementia   CVA with right sided weakness  Past Surgical History:  Procedure Laterality Date  . CHOLECYSTECTOMY       reports that she has never smoked. She has never used smokeless tobacco. She reports that she does not drink alcohol. Her drug history is not on file.  Allergies  Allergen Reactions  . Penicillins Hives and Anaphylaxis    Family History: Mother is deceased; she had a history of ovarian cancer, CHF, dementia. Father's history is unknown. She has six living siblings.  Prior to Admission medications   Medication Sig Start Date End Date Taking? Authorizing Provider  amLODipine (NORVASC) 5 MG tablet Take 5 mg by mouth daily.    Historical Provider, MD  apixaban (ELIQUIS) 5 MG TABS tablet Take 5 mg by mouth daily.    Historical Provider, MD  bisacodyl (DULCOLAX) 10 MG suppository Place 10 mg rectally daily as needed for moderate constipation.    Historical Provider, MD  capsaicin (ZOSTRIX) 0.025 % cream Apply 1 application topically 3 (three) times daily. Apply thin layer to bilateral knees    Historical Provider, MD  hydrochlorothiazide (MICROZIDE) 12.5 MG capsule Take 12.5 mg by mouth daily.    Historical Provider, MD  HYDROcodone-acetaminophen (NORCO/VICODIN) 5-325 MG tablet Take one tablet by mouth every six hours for pain; Take 1/2 tablet by mouth twice daily as needed for  breakthrough pain. Do not exceed 3gm of Tylenol in 24 hours 12/06/15   Kirt BoysMonica Baxter Gonzalez, DO  Hypromellose (ARTIFICIAL TEARS OP) Apply 1 drop to eye 4 (four) times daily as needed.    Historical Provider, MD  Melatonin 3 MG TABS Take 1 tablet by mouth at bedtime.    Historical Provider, MD  memantine (NAMENDA) 10 MG tablet Take 30 mg by mouth 2 (two) times daily.    Historical Provider, MD  ondansetron (ZOFRAN) 4 MG tablet Take 4 mg by mouth every 8 (eight) hours as needed  for nausea or vomiting.    Historical Provider, MD  polyethylene glycol (MIRALAX / GLYCOLAX) packet Take 17 g by mouth at bedtime.    Historical Provider, MD  ranitidine (ZANTAC) 75 MG tablet Take 75 mg by mouth at bedtime.    Historical Provider, MD  senna (SENOKOT) 8.6 MG tablet Take 1 tablet by mouth as needed for constipation.    Historical Provider, MD  simvastatin (ZOCOR) 10 MG tablet Take 10 mg by mouth daily.    Historical Provider, MD  traZODone (DESYREL) 100 MG tablet Take 100 mg by mouth at bedtime.    Historical Provider, MD    Physical Exam: Vitals:   12/15/15 2200 12/15/15 2215 12/15/15 2230 12/15/15 2300  BP: (!) 158/107 155/90 115/81 150/97  Pulse: 93 89 86 108  Resp: (!) 35 26 25 24   Temp:      TempSrc:      SpO2: 95% 100% 98% 98%  Weight:      Height:          Constitutional: NAD but ill appearing.  No purposeful interaction with me. Vitals:   12/15/15 2200 12/15/15 2215 12/15/15 2230 12/15/15 2300  BP: (!) 158/107 155/90 115/81 150/97  Pulse: 93 89 86 108  Resp: (!) 35 26 25 24   Temp:      TempSrc:      SpO2: 95% 100% 98% 98%  Weight:      Height:       Eyes: PERRL, lids and conjunctivae normal ENMT: Mucous membranes are DRY. Oropharynx not completely visualized because the patient will not cooperate with exam.  I do not see obvious abscess on the right.  Poor dentition.    Neck: normal appearance, supple. Respiratory: clear to auscultation listening anteriorly.  Normal respiratory effort. No accessory muscle use.  Cardiovascular: Mild tachycardia but regular.  No extremity edema. Barely palpable distal pulses.  Extremities are cool.  GI: abdomen is soft and compressible.  No distention.  No tenderness.  Bowel sounds are present. Musculoskeletal:  Muscle atropy in all extremities.  No joint deformity in upper and lower extremities. Moves left arm spontaneously.    Skin: no rashes, warm and dry Neurologic: Limited by mental status. Psychiatric: Flat  affect.  She does not have the capacity for judgement or insight into her condition.    Labs on Admission: I have personally reviewed following labs and imaging studies  CBC:  Recent Labs Lab 12/15/15 2032  WBC 16.2*  NEUTROABS 14.2*  HGB 17.8*  HCT 55.5*  MCV 101.1*  PLT 284   Basic Metabolic Panel:  Recent Labs Lab 12/15/15 2032  NA 167*  K 2.7*  CL 120*  CO2 25  GLUCOSE 224*  BUN 94*  CREATININE 4.67*  CALCIUM 9.3   GFR: Estimated Creatinine Clearance: 13.5 mL/min (by C-G formula based on SCr of 4.67 mg/dL (H)). Liver Function Tests:  Recent Labs Lab 12/15/15 2032  AST 112*  ALT 156*  ALKPHOS 77  BILITOT 3.8*  PROT 7.8  ALBUMIN 3.9   Urine analysis:    Component Value Date/Time   COLORURINE AMBER (A) 12/15/2015 2216   APPEARANCEUR CLOUDY (A) 12/15/2015 2216   LABSPEC 1.013 12/15/2015 2216   PHURINE 5.0 12/15/2015 2216   GLUCOSEU NEGATIVE 12/15/2015 2216   HGBUR LARGE (A) 12/15/2015 2216   BILIRUBINUR SMALL (A) 12/15/2015 2216   KETONESUR NEGATIVE 12/15/2015 2216   PROTEINUR NEGATIVE 12/15/2015 2216   UROBILINOGEN 2.0 (H) 09/08/2012 2138   NITRITE NEGATIVE 12/15/2015 2216   LEUKOCYTESUR TRACE (A) 12/15/2015 2216   Sepsis Labs:  First lactic acid level 6.75  Radiological Exams on Admission: Dg Chest Port 1 View  Result Date: 12/15/2015 CLINICAL DATA:  Sepsis EXAM: PORTABLE CHEST 1 VIEW COMPARISON:  09/08/2012 FINDINGS: Small amount of left basilar atelectasis versus mild medial infiltrate. No effusion. Heart size normal. No pneumothorax. IMPRESSION: Mild medial left base opacity could relate to mild atelectasis versus small infiltrate. Electronically Signed   By: Jasmine PangKim  Fujinaga M.D.   On: 12/15/2015 21:06    EKG: Independently reviewed. Sinus tachycardia, HR 104.  No acute ST segment changes.  Assessment/Plan Principal Problem:   AKI (acute kidney injury) (HCC) Active Problems:   Essential hypertension   History of DVT of lower extremity    Sepsis (HCC)   Hypernatremia   Acute metabolic encephalopathy   Lactic acidosis  NSTEMI    Acute metabolic encephalopathy secondary to AKI in the setting of apparent extreme dehydration and possible UTI (causing sepsis) --She has already received volume resuscitation with NS 30cc/kg.  Repeat CMP now before deciding on maintenance fluid rate. --I attempted to call nephrology consultant tonight (page sent twice).  Will need to request consult in the AM --CT A/P stone protocol now to assess for hydronephrosis, evidence of obstruction --She needs a foley catheter for strict I/O --Empiric antibiotics, treating as healthcare associated infection since she is from a SNF (aztreonam, levaquin) --Avoid nephrotoxic agents --NPO now including medications until mental status improves --Will also check head CT (primarily because she has been anticoagulated with Xarelto) to screen for acute intracranial pathology that could also be contributing to her encephalopathy  Sepsis secondary to UTI --Blood and urine cultures pending --Flu screen pending --Lactic acidosis showing signs of improvement --Will check procalcitonin level  NSTEMI --Greatly appreciate cardiology consultant --Will start aspirin per rectum if imaging OK (normal platelet count though I suspect Hgb is hemoconcentrated; no history of anemia), statin when tolerating PO, consider BB instead of amlodipine/HCTZ when tolerating PO --Echo in the AM  HTN --Holding oral meds due to mental status changes for now --IV hydralazine prn with parameters  Acute hypoxic respiratory failure --Case discussed with PCCM; will call back when imaging results are available --Attempt to wean NRB; patient may need higher level of care for intubation   DVT prophylaxis: Anticoagulated with Xarelto for a history of DVT; on hold due to AKI Code Status: FULL Family Communication: Patient's sister and nephew at bedside in the ED at time of  admission. Disposition Plan: To be determined. Consults called: Cardiology.  Nephrology never returned by page; will need to call again in the AM. Admission status: Inpatient, stepdown unit   TIME SPENT: 80 minutes   Jerene Bearsarter,Lauralee Waters Harrison MD [facility] Pager [phone]  If 7PM-7AM, please contact night-coverage [email or web address] Password [email or web address]  12/15/2015, 11:29 PM

## 2015-12-15 NOTE — ED Triage Notes (Signed)
From [facility] brought in via ems. Per ems pt state has been getting worse for the last 3 days. Baseline is non verbal right sided weakness able to squeeze hand and look around. Per EMS facility has given her rocephin and duo nebs today and cxray at facility was negative.

## 2015-12-15 NOTE — ED Notes (Signed)
Attempted report x1. 

## 2015-12-16 ENCOUNTER — Inpatient Hospital Stay (HOSPITAL_COMMUNITY): Payer: Medicare Other

## 2015-12-16 DIAGNOSIS — L8990 Pressure ulcer of unspecified site, unspecified stage: Secondary | ICD-10-CM | POA: Insufficient documentation

## 2015-12-16 DIAGNOSIS — I248 Other forms of acute ischemic heart disease: Secondary | ICD-10-CM

## 2015-12-16 DIAGNOSIS — G9340 Encephalopathy, unspecified: Secondary | ICD-10-CM

## 2015-12-16 LAB — BASIC METABOLIC PANEL
ANION GAP: 13 (ref 5–15)
BUN: 80 mg/dL — ABNORMAL HIGH (ref 6–20)
BUN: 71 mg/dL — ABNORMAL HIGH (ref 6–20)
CALCIUM: 7.9 mg/dL — AB (ref 8.9–10.3)
CALCIUM: 8.5 mg/dL — AB (ref 8.9–10.3)
CO2: 22 mmol/L (ref 22–32)
CO2: 22 mmol/L (ref 22–32)
Chloride: 126 mmol/L — ABNORMAL HIGH (ref 101–111)
Chloride: >130 mmol/L (ref 101–111)
Creatinine, Ser: 2.72 mg/dL — ABNORMAL HIGH (ref 0.44–1.00)
Creatinine, Ser: 2.19 mg/dL — ABNORMAL HIGH (ref 0.44–1.00)
GFR, EST AFRICAN AMERICAN: 20 mL/min — AB (ref >60)
GFR, EST AFRICAN AMERICAN: 26 mL/min — AB (ref >60)
GFR, EST NON AFRICAN AMERICAN: 17 mL/min — AB (ref >60)
GFR, EST NON AFRICAN AMERICAN: 23 mL/min — AB (ref >60)
GLUCOSE: 211 mg/dL — AB (ref 65–99)
Glucose, Bld: 297 mg/dL — ABNORMAL HIGH (ref 65–99)
POTASSIUM: 3.8 mmol/L (ref 3.5–5.1)
POTASSIUM: 2.9 mmol/L — AB (ref 3.5–5.1)
SODIUM: 161 mmol/L — AB (ref 135–145)
SODIUM: 162 mmol/L — AB (ref 135–145)

## 2015-12-16 LAB — COMPREHENSIVE METABOLIC PANEL
ALBUMIN: 3.5 g/dL (ref 3.5–5.0)
ALK PHOS: 62 U/L (ref 38–126)
ALT: 127 U/L — ABNORMAL HIGH (ref 14–54)
AST: 94 U/L — AB (ref 15–41)
Anion gap: 18 — ABNORMAL HIGH (ref 5–15)
BILIRUBIN TOTAL: 2.9 mg/dL — AB (ref 0.3–1.2)
BUN: 92 mg/dL — AB (ref 6–20)
CALCIUM: 8.3 mg/dL — AB (ref 8.9–10.3)
CO2: 23 mmol/L (ref 22–32)
Chloride: 126 mmol/L — ABNORMAL HIGH (ref 101–111)
Creatinine, Ser: 3.87 mg/dL — ABNORMAL HIGH (ref 0.44–1.00)
GFR calc Af Amer: 13 mL/min — ABNORMAL LOW (ref >60)
GFR calc non Af Amer: 11 mL/min — ABNORMAL LOW (ref >60)
GLUCOSE: 222 mg/dL — AB (ref 65–99)
Potassium: 3 mmol/L — ABNORMAL LOW (ref 3.5–5.1)
Sodium: 167 mmol/L (ref 135–145)
TOTAL PROTEIN: 6.9 g/dL (ref 6.5–8.1)

## 2015-12-16 LAB — LACTIC ACID, PLASMA
Lactic Acid, Venous: 5.9 mmol/L (ref 0.5–1.9)
Lactic Acid, Venous: 3.4 mmol/L (ref 0.5–1.9)

## 2015-12-16 LAB — TROPONIN I
TROPONIN I: 0.94 ng/mL — AB (ref <0.03)
Troponin I: 1.39 ng/mL (ref <0.03)
Troponin I: 1.28 ng/mL (ref <0.03)
Troponin I: 0.97 ng/mL (ref <0.03)

## 2015-12-16 LAB — GLUCOSE, CAPILLARY
GLUCOSE-CAPILLARY: 274 mg/dL — AB (ref 65–99)
GLUCOSE-CAPILLARY: 98 mg/dL (ref 65–99)
GLUCOSE-CAPILLARY: 128 mg/dL — AB (ref 65–99)
Glucose-Capillary: 226 mg/dL — ABNORMAL HIGH (ref 65–99)
Glucose-Capillary: 86 mg/dL (ref 65–99)

## 2015-12-16 LAB — PROTIME-INR
INR: 1.4
Prothrombin Time: 17.3 seconds — ABNORMAL HIGH (ref 11.4–15.2)

## 2015-12-16 LAB — INFLUENZA PANEL BY PCR (TYPE A & B)
INFLAPCR: NEGATIVE
INFLBPCR: NEGATIVE

## 2015-12-16 LAB — TSH: TSH: 0.906 u[IU]/mL (ref 0.350–4.500)

## 2015-12-16 LAB — I-STAT CG4 LACTIC ACID, ED: LACTIC ACID, VENOUS: 5.22 mmol/L — AB (ref 0.5–1.9)

## 2015-12-16 LAB — MRSA PCR SCREENING: MRSA BY PCR: NEGATIVE

## 2015-12-16 LAB — PROCALCITONIN: Procalcitonin: 2.46 ng/mL

## 2015-12-16 LAB — APTT

## 2015-12-16 MED ORDER — FAMOTIDINE IN NACL 20-0.9 MG/50ML-% IV SOLN
20.0000 mg | INTRAVENOUS | Status: DC
  Administered 2015-12-17 – 2015-12-25 (×9): 20 mg via INTRAVENOUS
  Filled 2015-12-16 (×9): qty 50

## 2015-12-16 MED ORDER — ONDANSETRON HCL 4 MG/2ML IJ SOLN: 4.0000 mg | Freq: Four times a day (QID) | INTRAMUSCULAR | Status: DC | PRN

## 2015-12-16 MED ORDER — ONDANSETRON HCL 4 MG PO TABS
4.0000 mg | ORAL_TABLET | Freq: Four times a day (QID) | ORAL | Status: DC | PRN
Start: 2015-12-16 — End: 2015-12-25

## 2015-12-16 MED ORDER — ASPIRIN 300 MG RE SUPP
300.0000 mg | Freq: Every day | RECTAL | Status: DC
Start: 2015-12-16 — End: 2015-12-17
  Administered 2015-12-16: 300 mg via RECTAL
  Filled 2015-12-16: qty 1

## 2015-12-16 MED ORDER — SODIUM CHLORIDE 0.9% FLUSH
3.0000 mL | Freq: Two times a day (BID) | INTRAVENOUS | Status: DC
  Administered 2015-12-16 – 2015-12-25 (×10): 3 mL via INTRAVENOUS

## 2015-12-16 MED ORDER — DEXTROSE 5 % IV SOLN
500.0000 mg | Freq: Three times a day (TID) | INTRAVENOUS | Status: DC
  Administered 2015-12-16 (×3): 500 mg via INTRAVENOUS
  Filled 2015-12-16 (×4): qty 0.5

## 2015-12-16 MED ORDER — DEXTROSE 5 % IV SOLN
500.0000 mg | Freq: Once | INTRAVENOUS | Status: AC
  Administered 2015-12-16: 500 mg via INTRAVENOUS
  Filled 2015-12-16: qty 0.5

## 2015-12-16 MED ORDER — FAMOTIDINE IN NACL 20-0.9 MG/50ML-% IV SOLN
20.0000 mg | Freq: Two times a day (BID) | INTRAVENOUS | Status: DC
  Administered 2015-12-16 (×2): 20 mg via INTRAVENOUS
  Filled 2015-12-16 (×2): qty 50

## 2015-12-16 MED ORDER — DEXTROSE 5 % IV SOLN
INTRAVENOUS | Status: DC
  Administered 2015-12-16: 03:00:00 via INTRAVENOUS

## 2015-12-16 MED ORDER — ACETAMINOPHEN 650 MG RE SUPP: 650.0000 mg | Freq: Four times a day (QID) | RECTAL | Status: DC | PRN

## 2015-12-16 MED ORDER — HEPARIN BOLUS VIA INFUSION
2000.0000 [IU] | Freq: Once | INTRAVENOUS | Status: AC
  Administered 2015-12-16: 2000 [IU] via INTRAVENOUS
  Filled 2015-12-16: qty 2000

## 2015-12-16 MED ORDER — SODIUM CHLORIDE 0.9 % IV SOLN
Freq: Once | INTRAVENOUS | Status: AC
  Administered 2015-12-16: 02:00:00 via INTRAVENOUS
  Filled 2015-12-16: qty 1000

## 2015-12-16 MED ORDER — HEPARIN (PORCINE) IN NACL 100-0.45 UNIT/ML-% IJ SOLN
800.0000 [IU]/h | INTRAMUSCULAR | Status: DC
  Administered 2015-12-16: 1200 [IU]/h via INTRAVENOUS
  Filled 2015-12-16 (×2): qty 250

## 2015-12-16 MED ORDER — ACETAMINOPHEN 325 MG PO TABS
650.0000 mg | ORAL_TABLET | Freq: Four times a day (QID) | ORAL | Status: DC | PRN
  Administered 2015-12-25: 650 mg via ORAL
  Filled 2015-12-16: qty 2

## 2015-12-16 MED ORDER — HYDRALAZINE HCL 20 MG/ML IJ SOLN: 20.0000 mg | Freq: Four times a day (QID) | INTRAMUSCULAR | Status: DC | PRN

## 2015-12-16 MED ORDER — INSULIN ASPART 100 UNIT/ML ~~LOC~~ SOLN
0.0000 [IU] | SUBCUTANEOUS | Status: DC
  Administered 2015-12-16: 2 [IU] via SUBCUTANEOUS
  Administered 2015-12-16: 8 [IU] via SUBCUTANEOUS
  Administered 2015-12-16: 5 [IU] via SUBCUTANEOUS
  Administered 2015-12-17: 3 [IU] via SUBCUTANEOUS
  Administered 2015-12-17 (×3): 2 [IU] via SUBCUTANEOUS
  Administered 2015-12-18: 3 [IU] via SUBCUTANEOUS
  Administered 2015-12-18: 2 [IU] via SUBCUTANEOUS
  Administered 2015-12-19: 3 [IU] via SUBCUTANEOUS
  Administered 2015-12-19 – 2015-12-20 (×4): 2 [IU] via SUBCUTANEOUS
  Administered 2015-12-20: 3 [IU] via SUBCUTANEOUS
  Administered 2015-12-21 (×3): 2 [IU] via SUBCUTANEOUS

## 2015-12-16 MED ORDER — LEVOFLOXACIN IN D5W 500 MG/100ML IV SOLN
500.0000 mg | Freq: Once | INTRAVENOUS | Status: AC
  Administered 2015-12-16: 500 mg via INTRAVENOUS
  Filled 2015-12-16: qty 100

## 2015-12-16 MED ORDER — MORPHINE SULFATE (PF) 2 MG/ML IV SOLN
1.0000 mg | INTRAVENOUS | Status: DC | PRN
  Administered 2015-12-16 – 2015-12-19 (×13): 1 mg via INTRAVENOUS
  Filled 2015-12-16 (×15): qty 1

## 2015-12-16 MED ORDER — LEVOFLOXACIN IN D5W 500 MG/100ML IV SOLN: 500.0000 mg | INTRAVENOUS | Status: DC

## 2015-12-16 NOTE — Progress Notes (Signed)
Attempted bedside swallow evaluation patient was unable to follow commands and participate at this time.

## 2015-12-16 NOTE — Progress Notes (Signed)
CRITICAL VALUE ALERT  Critical value received:  Lactic Acid 3.4  Date of notification:  0840  Time of notification:  [phone]  Critical value read back:yes   Nurse who received alert:  Raymon MuttonWhitney Davis  MD notified (1st page):  Dr. Catha GosselinMikhail (notified in person)  Time of first page:  [phone]  MD notified (2nd page):  Time of second page:  Responding MD:  Dr. Catha GosselinMikhail  Time MD responded: [phone]

## 2015-12-16 NOTE — Progress Notes (Signed)
Spoke with MD Michael LitterNikki Carter about pt's critical labs Na 167, Latic acid >5, and elevated troponin that is trending down.  New orders given.  MD communicated the need to wean pt off NRB if able.  Will speak to respiratory about this.

## 2015-12-16 NOTE — Consult Note (Signed)
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 Wound type: Pressure, trauma Pressure Ulcer POA: Yes Measurement: left heel:  3.5cm x 4cm area of deep purple, maroon discoloration consistent with definition of a DTPI. Left foot at the plantar aspect of the 3-5 metatarsal heads:  1cm round fluid filled blister surrounded by 2.5 x 4.5cm area of ecchymosis (dried blood beneath the skin). Wound bed: As described above Drainage (amount, consistency, odor) None Periwound: Intact, dry Dressing procedure/placement/frequency: I will provide the patient with bilateral pressure redistribution heel boots.  Topical care will be application of the antiseptic and astringent betadine swabstick, allowed to air-dry twice daily in addition to pressure redistribution. WOC nursing team will not follow, but will remain available to this patient, the nursing and medical teams.  Please re-consult if needed. Thanks, Ladona MowLaurie Bobak Oguinn, MSN, RN, GNP, Hans EdenCWOCN, CWON-AP, FAAN  Pager# [phone]

## 2015-12-16 NOTE — Progress Notes (Signed)
CRITICAL VALUE ALERT  Critical value received:  NA 162, Chloride >130  Date of notification:  12/16/2015  Time of notification:  1330  Critical value read back:yes  Nurse who received alert:  Silvio PateBarbara Sharlotte Baka  MD notified (1st page):  Dr. Catha GosselinMikhail  Time of first page:  1340  MD notified (2nd page):  Time of second page:  Responding MD:  Dr. Catha GosselinMikhail  Time MD responded:  1340

## 2015-12-16 NOTE — Progress Notes (Signed)
Pt arrives to 3s15 from ED after report.  Pt is nonverbal, R side weaker than L.  Orders transferred and carried out.  Bed alarm on with call bell within reach

## 2015-12-16 NOTE — Progress Notes (Signed)
Pharmacy Antibiotic Note  Janet Griffin is a 65 y.o. female admitted on 12/15/2015 with concern for sepsis 2/2 UTI.  Pharmacy has been consulted for Azactam and Levaquin dosing.  Pt w/ AKI, SCr 4.7 w/ baseline 0.8 34mo ago.  Also hypokalemic.  Plan: Azactam 500mg  IV Q8H. Levaquin 500mg  IV Q48H. Monitor SCr for dose adjustments.  Height: 5\' 7"  (170.2 cm) Weight: 180 lb (81.6 kg) IBW/kg (Calculated) : 61.6  Temp (24hrs), Avg:101.7 F (38.7 C), Min:101.7 F (38.7 C), Max:101.7 F (38.7 C)   Recent Labs Lab 12/15/15 2032 12/15/15 2053 12/15/15 2337  WBC 16.2*  --   --   CREATININE 4.67*  --   --   LATICACIDVEN  --  6.75* 9.31*    Estimated Creatinine Clearance: 13.5 mL/min (by C-G formula based on SCr of 4.67 mg/dL (H)).    Allergies  Allergen Reactions  . Penicillins Hives and Anaphylaxis     Thank you for allowing pharmacy to be a part of this patient's care.  Vernard GamblesVeronda Jevon Shells, PharmD, BCPS  12/16/2015 12:08 AM

## 2015-12-16 NOTE — Progress Notes (Signed)
PROGRESS NOTE    Janet Griffin  WUJ:[id number] DOB: Oct 28, 1950 DOA: 12/15/2015 PCP: Keane Police, MD   Chief Complaint  Patient presents with  . Altered Mental Status    Brief Narrative:  HPI on 12/15/2015 by Dr. Michael Litter [name] is a 65 y.o. woman with a history of CVA with right sided weakness and dementia (she is nonverbal at baseline) who is a long term resident of a SNF.  She has been transferred to the ED for evaluation of mental status changes including increased lethargy (sleeping more), decreased PO intake, decreased eye contact with family.  She cannot verbalize specific pain complaints.  Her sister reports a history of a tooth abscess, for which the patient has completed a course of oral antibiotics but the tooth has not been pulled. Fever was documented prior to presentation.  She has had cough and increased wheezing per her sister.  The facility has reported decreased urine output, and her sister feels that the urine has an increased odor. Of note, she is also XARELTO for a history of LLE DVT.  Assessment & Plan   Severe sepsis secondary to UTI versus pneumonia -Upon admission, Noted to be febrile with leukocytosis, elevated troponin, AK I, lactic acid 9.37 -Kidney injury currently improving, lactic acid improving as well. -Continue IV fluids, broad-spectrum antibiotics with aztreonam and Levaquin, patient does have a penicillin allergy -Pending blood and urine cultures -Chest x-ray mild medial left base opacity- atelectasis vs infiltrate -Will order strep pneumonia urine antigen  Acute metabolic encephalopathy -possible related to infection -poor baseline   Acute hypoxic respiratory failure -SpO2 89%, pO2 on ABG 75 -Currently on nasal canula, will attempt to wean  Elevated troponin -Likely secondary to sepsis/demand ischemia -Peaked at 1.39, currently trending downward 0.94 -Echocardiogram pending -Cardiology consulted and appreciated-  recommended medical management if echocardiogram ok, would not pursue ischemic testing  Acute kidney injury -Secondary to sepsis and dehydration -Creatinine <1 at baseline, was up to 4.67 upon admission -Currently improving, 2.72 -Continue to monitor BMP  Hypernatremia and hyperchloremia -Likely secondary to the above -Continue IVF with D5  -Monitor BMP  Essential Hypertension -Continue hydralazine PRN  Possible history CVA -CT head unremarkable -Reviewed prior CTs, no CVAs noted  -Per family, patient became nonverbal last year, started having left sided weakness starting about 8 months ago  History of LLE DVT -patient was on Xarelto -Will place on IV heparin  Dysphagia -Per sister, due to abscessed tooth -Will continue to monitor  Code status -Spoke with sister who is POA, patient is a full code.    DVT Prophylaxis  heparin  Code Status: Full  Family Communication: None at bedside, sister via phone  Disposition Plan: Admitted. Continue to monitor at bedside  Consultants Cardiology  Procedures  None  Antibiotics   Anti-infectives    Start     Dose/Rate Route Frequency Ordered Stop   12/18/15 0000  levofloxacin (LEVAQUIN) IVPB 500 mg     500 mg 100 mL/hr over 60 Minutes Intravenous Every 48 hours 12/16/15 0012     12/16/15 0800  aztreonam (AZACTAM) 500 mg in dextrose 5 % 50 mL IVPB     500 mg 100 mL/hr over 30 Minutes Intravenous Every 8 hours 12/16/15 0012     12/16/15 0015  aztreonam (AZACTAM) 500 mg in dextrose 5 % 50 mL IVPB     500 mg 100 mL/hr over 30 Minutes Intravenous  Once 12/16/15 0012 12/16/15 0151   12/16/15 0015  levofloxacin (LEVAQUIN) IVPB  500 mg     500 mg 100 mL/hr over 60 Minutes Intravenous  Once 12/16/15 0012 12/16/15 0419      Subjective:   Orie RoutLennetta Inscore seen and examined today.  Patient is nonverbal  Objective:   Vitals:   12/16/15 0500 12/16/15 0700 12/16/15 0807 12/16/15 1100  BP:  (!) 143/71    Pulse:  86    Resp:   (!) 23    Temp:  (!) 96.6 F (35.9 C) 97.2 F (36.2 C) 98.1 F (36.7 C)  TempSrc:  Axillary Axillary Axillary  SpO2:  100%    Weight: 73.5 kg (162 lb 0.6 oz)     Height:        Intake/Output Summary (Last 24 hours) at 12/16/15 1311 Last data filed at 12/16/15 0900  Gross per 24 hour  Intake           336.67 ml  Output              175 ml  Net           161.67 ml   Filed Weights   12/15/15 2004 12/16/15 0500  Weight: 81.6 kg (180 lb) 73.5 kg (162 lb 0.6 oz)    Exam  General: Well developed, well nourished, NAD, appears stated age  HEENT: NCAT,  mucous membranes dry   Cardiovascular: S1 S2 auscultated, no murmurs, RRR  Respiratory: Clear to auscultation bilaterally with equal chest rise  Abdomen: Soft, nontender, nondistended, + bowel sounds  Extremities: warm dry without cyanosis clubbing or edema  Neuro: Cannot assess  Psych: Cannot assess   Data Reviewed: I have personally reviewed following labs and imaging studies  CBC:  Recent Labs Lab 12/15/15 2032  WBC 16.2*  NEUTROABS 14.2*  HGB 17.8*  HCT 55.5*  MCV 101.1*  PLT 284   Basic Metabolic Panel:  Recent Labs Lab 12/15/15 2032 12/16/15 0030 12/16/15 0728  NA 167* 167* 161*  K 2.7* 3.0* 3.8  CL 120* 126* 126*  CO2 25 23 22   GLUCOSE 224* 222* 297*  BUN 94* 92* 80*  CREATININE 4.67* 3.87* 2.72*  CALCIUM 9.3 8.3* 7.9*   GFR: Estimated Creatinine Clearance: 20.6 mL/min (by C-G formula based on SCr of 2.72 mg/dL (H)). Liver Function Tests:  Recent Labs Lab 12/15/15 2032 12/16/15 0030  AST 112* 94*  ALT 156* 127*  ALKPHOS 77 62  BILITOT 3.8* 2.9*  PROT 7.8 6.9  ALBUMIN 3.9 3.5   No results for input(s): LIPASE, AMYLASE in the last 168 hours. No results for input(s): AMMONIA in the last 168 hours. Coagulation Profile:  Recent Labs Lab 12/16/15 0328  INR 1.40   Cardiac Enzymes:  Recent Labs Lab 12/16/15 0030 12/16/15 0328 12/16/15 0516 12/16/15 0728  TROPONINI 1.39* 1.28*  0.97* 0.94*   BNP (last 3 results) No results for input(s): PROBNP in the last 8760 hours. HbA1C: No results for input(s): HGBA1C in the last 72 hours. CBG:  Recent Labs Lab 12/16/15 0804 12/16/15 1129  GLUCAP 274* 226*   Lipid Profile: No results for input(s): CHOL, HDL, LDLCALC, TRIG, CHOLHDL, LDLDIRECT in the last 72 hours. Thyroid Function Tests:  Recent Labs  12/16/15 0328  TSH 0.906   Anemia Panel: No results for input(s): VITAMINB12, FOLATE, FERRITIN, TIBC, IRON, RETICCTPCT in the last 72 hours. Urine analysis:    Component Value Date/Time   COLORURINE AMBER (A) 12/15/2015 2216   APPEARANCEUR CLOUDY (A) 12/15/2015 2216   LABSPEC 1.013 12/15/2015 2216   PHURINE 5.0 12/15/2015  2216   GLUCOSEU NEGATIVE 12/15/2015 2216   HGBUR LARGE (A) 12/15/2015 2216   BILIRUBINUR SMALL (A) 12/15/2015 2216   KETONESUR NEGATIVE 12/15/2015 2216   PROTEINUR NEGATIVE 12/15/2015 2216   UROBILINOGEN 2.0 (H) 09/08/2012 2138   NITRITE NEGATIVE 12/15/2015 2216   LEUKOCYTESUR TRACE (A) 12/15/2015 2216   Sepsis Labs: @LABRCNTIP (procalcitonin:4,lacticidven:4)  ) Recent Results (from the past 240 hour(s))  Blood Culture (routine x 2)     Status: None (Preliminary result)   Collection Time: 12/15/15  8:32 PM  Result Value Ref Range Status   Specimen Description BLOOD LEFT ANTECUBITAL  Final   Special Requests BOTTLES DRAWN AEROBIC AND ANAEROBIC 5CC  Final   Culture NO GROWTH < 24 HOURS  Final   Report Status PENDING  Incomplete  Blood Culture (routine x 2)     Status: None (Preliminary result)   Collection Time: 12/15/15  9:10 PM  Result Value Ref Range Status   Specimen Description BLOOD RIGHT HAND  Final   Special Requests IN PEDIATRIC BOTTLE 4CC  Final   Culture NO GROWTH < 24 HOURS  Final   Report Status PENDING  Incomplete  MRSA PCR Screening     Status: None   Collection Time: 12/16/15  6:30 AM  Result Value Ref Range Status   MRSA by PCR NEGATIVE NEGATIVE Final     Comment:        The GeneXpert MRSA Assay (FDA approved for NASAL specimens only), is one component of a comprehensive MRSA colonization surveillance program. It is not intended to diagnose MRSA infection nor to guide or monitor treatment for MRSA infections.       Radiology Studies: Ct Head Wo Contrast  Result Date: 12/16/2015 CLINICAL DATA:  Encephalopathy and dementia EXAM: CT HEAD WITHOUT CONTRAST TECHNIQUE: Contiguous axial images were obtained from the base of the skull through the vertex without intravenous contrast. COMPARISON:  Head CT 05/15/2014 FINDINGS: Brain: There is advanced, diffuse supratentorial volume loss with associated ex vacuo dilatation of the lateral and third ventricles. There is no acute intracranial hemorrhage. No CT evidence of acute cortical infarct. No midline shift or mass lesion. There is periventricular hypoattenuation compatible with chronic microvascular disease. Vascular: No hyperdense vessel or unexpected calcification. Skull: Normal. Negative for fracture or focal lesion. Sinuses/Orbits: The visualized portions of the paranasal sinuses and mastoid air cells are free of fluid. No advanced mucosal thickening. The visualized orbits are normal. Other: None. IMPRESSION: 1. Advanced atrophy with findings of chronic microvascular disease. 2. No acute intracranial abnormality. Electronically Signed   By: Deatra Robinson M.D.   On: 12/16/2015 01:25   Dg Chest Port 1 View  Result Date: 12/15/2015 CLINICAL DATA:  Sepsis EXAM: PORTABLE CHEST 1 VIEW COMPARISON:  09/08/2012 FINDINGS: Small amount of left basilar atelectasis versus mild medial infiltrate. No effusion. Heart size normal. No pneumothorax. IMPRESSION: Mild medial left base opacity could relate to mild atelectasis versus small infiltrate. Electronically Signed   By: Jasmine Pang M.D.   On: 12/15/2015 21:06   Ct Renal Stone Study  Result Date: 12/16/2015 CLINICAL DATA:  Acute kidney injury. Concern for  sepsis and urinary tract infection. EXAM: CT ABDOMEN AND PELVIS WITHOUT CONTRAST TECHNIQUE: Multidetector CT imaging of the abdomen and pelvis was performed following the standard protocol without IV contrast. COMPARISON:  Pelvic radiograph 09/08/2012 FINDINGS: Lower chest: Respiratory motion somewhat degrades evaluation of the lung bases. There is consolidation versus atelectasis in the left lung base. Milder right basilar atelectasis. No pleural effusion. Hepatobiliary:  Normal hepatic size and contours. No perihepatic ascites. No intra- or extrahepatic biliary dilatation. The gallbladder is surgically absent. Pancreas: Normal pancreatic contours. No peripancreatic fluid collection or pancreatic ductal dilatation. Spleen: Small but otherwise normal. Adrenals/Urinary Tract: Normal adrenal glands. No hydronephrosis or nephrolithiasis. No abnormal perinephric stranding. No ureteral obstruction. Stomach/Bowel: No abnormal bowel dilatation. No bowel wall thickening or adjacent fat stranding to indicate acute inflammation. No abdominal fluid collection. Normal appendix. Vascular/Lymphatic: Atherosclerotic calcification of the non aneurysmal abdominal aorta. No abdominal or pelvic adenopathy. Reproductive: Hyperdense lesion at the left uterine fundus measures 3.5 cm. Ovaries are not clearly identified. No free fluid in the pelvis. Musculoskeletal: Right greater than left hip osteoarthrosis. Lower lumbar facet arthrosis and osteophytosis. Small amounts of right lower quadrant subcutaneous gas. Question recent subcutaneous injection (such as Lovenox or insulin). Other: No contributory non-categorized findings. IMPRESSION: 1. No obstructive uropathy or perinephric stranding. Assessment for pyelonephritis is limited in the absence of IV contrast. 2. Consolidative opacity in the left lower lobe, infection versus atelectasis. 3. Subcutaneous gas in the right lower quadrant is likely iatrogenic. Correlate for recent subcutaneous  injection. 4. Aortic atherosclerosis. Electronically Signed   By: Deatra RobinsonKevin  Herman M.D.   On: 12/16/2015 01:10     Scheduled Meds: . aspirin  300 mg Rectal Daily  . aztreonam  500 mg Intravenous Q8H  . [START ON 12/17/2015] famotidine (PEPCID) IV  20 mg Intravenous Q24H  . insulin aspart  0-15 Units Subcutaneous Q4H  . [START ON 12/18/2015] levofloxacin (LEVAQUIN) IV  500 mg Intravenous Q48H  . sodium chloride flush  3 mL Intravenous Q12H   Continuous Infusions: . dextrose 100 mL/hr at 12/16/15 0308     LOS: 1 day   Time Spent in minutes   30 minutes  Kashlyn Salinas D.O. on 12/16/2015 at 1:11 PM  Between 7am to 7pm - Pager - [phone]  After 7pm go to [email or web address] - password [email or web address]  And look for the night coverage person covering for me after hours  [facility] Office  [phone]

## 2015-12-16 NOTE — Progress Notes (Signed)
     Subjective:     Pt nonverbal.   Objective:   Temp:  [96.6 F (35.9 C)-101.7 F (38.7 C)] 98.1 F (36.7 C) (11/04 1100) Pulse Rate:  [86-108] 86 (11/04 0700) Resp:  [20-59] 23 (11/04 0700) BP: (112-158)/(71-107) 143/71 (11/04 0700) SpO2:  [89 %-100 %] 100 % (11/04 0700) Weight:  [162 lb 0.6 oz (73.5 kg)-180 lb (81.6 kg)] 162 lb 0.6 oz (73.5 kg) (11/04 0500) Last BM Date: 12/16/15  Filed Weights   12/15/15 2004 12/16/15 0500  Weight: 180 lb (81.6 kg) 162 lb 0.6 oz (73.5 kg)    Intake/Output Summary (Last 24 hours) at 12/16/15 1203 Last data filed at 12/16/15 0900  Gross per 24 hour  Intake           336.67 ml  Output              175 ml  Net           161.67 ml    Telemetry:SR  Exam:  General: NAD  HEENT: sclera clear, throat clear  Resp: CTAB  Cardiac: RRR, no m/r/g, no jvd  ZO:XWRUEAVGI:abdomen soft, NT, ND  MSK:no LE edema  Neuro: cannot assess, does not cooperate with exam  Psych: cannot assess, nonverbal  Lab Results:  Basic Metabolic Panel:  Recent Labs Lab 12/15/15 2032 12/16/15 0030 12/16/15 0728  NA 167* 167* 161*  K 2.7* 3.0* 3.8  CL 120* 126* 126*  CO2 25 23 22   GLUCOSE 224* 222* 297*  BUN 94* 92* 80*  CREATININE 4.67* 3.87* 2.72*  CALCIUM 9.3 8.3* 7.9*    Liver Function Tests:  Recent Labs Lab 12/15/15 2032 12/16/15 0030  AST 112* 94*  ALT 156* 127*  ALKPHOS 77 62  BILITOT 3.8* 2.9*  PROT 7.8 6.9  ALBUMIN 3.9 3.5    CBC:  Recent Labs Lab 12/15/15 2032  WBC 16.2*  HGB 17.8*  HCT 55.5*  MCV 101.1*  PLT 284    Cardiac Enzymes:  Recent Labs Lab 12/16/15 0328 12/16/15 0516 12/16/15 0728  TROPONINI 1.28* 0.97* 0.94*    BNP: No results for input(s): PROBNP in the last 8760 hours.  Coagulation:  Recent Labs Lab 12/16/15 0328  INR 1.40    ECG:   Medications:   Scheduled Medications: . aspirin  300 mg Rectal Daily  . aztreonam  500 mg Intravenous Q8H  . famotidine (PEPCID) IV  20 mg Intravenous  Q12H  . insulin aspart  0-15 Units Subcutaneous Q4H  . [START ON 12/18/2015] levofloxacin (LEVAQUIN) IV  500 mg Intravenous Q48H  . sodium chloride flush  3 mL Intravenous Q12H     Infusions: . dextrose 100 mL/hr at 12/16/15 0308     PRN Medications:  acetaminophen **OR** acetaminophen, hydrALAZINE, morphine injection, ondansetron **OR** ondansetron (ZOFRAN) IV     Assessment/Plan   1. Elevated troponin - patient admitted with AMS, fever, SOB. Evidence of sepsis due to possible UTI - trop peak to 1.28, now trending down. EKG is nonischemic - echo pending - probable demand ischemia in setting of sepsis, acute obstructive CAD seems less likely - patient with prior CVA, is nonverbal and nursing home bound, significant AKI, on admission. Poor candidate for invasive procedures. If echo ok I would recommend medical management, not pursue ischemic testing.        Dina RichJonathan Myrian Griffin, M.D., F.A.C.C.Patient ID: Janet Griffin, female   DOB: Feb 27, 1950, 65 y.o.   MRN: [id number]

## 2015-12-16 NOTE — Progress Notes (Signed)
Contacted pharmacy to see if heparin gtt and D5 are compatible, pharmacist cleared both to run together.

## 2015-12-16 NOTE — Progress Notes (Signed)
Critical Lactic Acid of 5.9 MD Michael LitterNikki Carter notified of this as well as pt's low urine output, no new orders given at this time. Will continue to monitor.

## 2015-12-16 NOTE — Progress Notes (Addendum)
ANTICOAGULATION CONSULT NOTE - Initial Consult  Pharmacy Consult for heparin  Indication: hx DVT (on PTA Eliquis)  Allergies  Allergen Reactions  . Penicillins Hives and Anaphylaxis    Patient Measurements: Height: 5\' 7"  (170.2 cm) Weight: 162 lb 0.6 oz (73.5 kg) IBW/kg (Calculated) : 61.6 Heparin Dosing Weight: 73.5  Vital Signs: Temp: 98.1 F (36.7 C) (11/04 1100) Temp Source: Axillary (11/04 1100) BP: 143/71 (11/04 0700) Pulse Rate: 86 (11/04 0700)  Labs:  Recent Labs  12/15/15 2032  12/16/15 0030 12/16/15 0328 12/16/15 0516 12/16/15 0728 12/16/15 1255  HGB 17.8*  --   --   --   --   --   --   HCT 55.5*  --   --   --   --   --   --   PLT 284  --   --   --   --   --   --   APTT  --   --   --  <20*  --   --   --   LABPROT  --   --   --  17.3*  --   --   --   INR  --   --   --  1.40  --   --   --   CREATININE 4.67*  --  3.87*  --   --  2.72* 2.19*  TROPONINI  --   < > 1.39* 1.28* 0.97* 0.94*  --   < > = values in this interval not displayed.  Estimated Creatinine Clearance: 25.6 mL/min (by C-G formula based on SCr of 2.19 mg/dL (H)).   Medical History: Past Medical History:  Diagnosis Date  . Dementia     Assessment: 65 yo female admitted with increased lethargy, decreased intake, and change in mental status. Of note, patient has hx of CVA and dementia. CT from 11/3 showing advanced atrophy and chronic microvascular disease - no acute abnormalities. She is nonverbal at baseline. Patient taking Eliquis PTA for hx of DVT. Last dose given 11/3 at 1300. Baseline aPTT < 20. Will order heparin level and aPTT to ensure values are correlating. CBC stable, no s/s bleeding.   Goal of Therapy:  Heparin level 0.3-0.7 units/ml Monitor platelets by anticoagulation protocol: Yes   Plan:  Bolus heparin 2000 units x1 Start heparin infusion at 1200 units/hr Check anti-Xa level and aPTT in 8 hours and daily while on heparin Continue to monitor H&H and platelets daily   Eliquis on Hold  Monitor for s/s bleeding  York CeriseKatherine Cook, PharmD Pharmacy Resident  Pager [phone] 12/16/15 2:57 PM

## 2015-12-17 ENCOUNTER — Inpatient Hospital Stay (HOSPITAL_COMMUNITY): Payer: Medicare Other

## 2015-12-17 DIAGNOSIS — N179 Acute kidney failure, unspecified: Secondary | ICD-10-CM

## 2015-12-17 LAB — CBC
HCT: 42.4 % (ref 36.0–46.0)
Hemoglobin: 13.3 g/dL (ref 12.0–15.0)
MCH: 31.6 pg (ref 26.0–34.0)
MCHC: 31.4 g/dL (ref 30.0–36.0)
MCV: 100.7 fL — ABNORMAL HIGH (ref 78.0–100.0)
PLATELETS: 205 10*3/uL (ref 150–400)
RBC: 4.21 MIL/uL (ref 3.87–5.11)
RDW: 13.8 % (ref 11.5–15.5)
WBC: 18.7 10*3/uL — AB (ref 4.0–10.5)

## 2015-12-17 LAB — BASIC METABOLIC PANEL
ANION GAP: 9 (ref 5–15)
BUN: 44 mg/dL — ABNORMAL HIGH (ref 6–20)
CALCIUM: 8.4 mg/dL — AB (ref 8.9–10.3)
CO2: 23 mmol/L (ref 22–32)
Chloride: 129 mmol/L — ABNORMAL HIGH (ref 101–111)
Creatinine, Ser: 1.27 mg/dL — ABNORMAL HIGH (ref 0.44–1.00)
GFR calc Af Amer: 51 mL/min — ABNORMAL LOW (ref >60)
GFR, EST NON AFRICAN AMERICAN: 44 mL/min — AB (ref >60)
Glucose, Bld: 138 mg/dL — ABNORMAL HIGH (ref 65–99)
POTASSIUM: 2.8 mmol/L — AB (ref 3.5–5.1)
SODIUM: 161 mmol/L — AB (ref 135–145)

## 2015-12-17 LAB — URINE CULTURE: CULTURE: NO GROWTH

## 2015-12-17 LAB — GLUCOSE, CAPILLARY
GLUCOSE-CAPILLARY: 177 mg/dL — AB (ref 65–99)
GLUCOSE-CAPILLARY: 139 mg/dL — AB (ref 65–99)
GLUCOSE-CAPILLARY: 121 mg/dL — AB (ref 65–99)
GLUCOSE-CAPILLARY: 101 mg/dL — AB (ref 65–99)
Glucose-Capillary: 143 mg/dL — ABNORMAL HIGH (ref 65–99)
Glucose-Capillary: 89 mg/dL (ref 65–99)

## 2015-12-17 LAB — MAGNESIUM: MAGNESIUM: 2.2 mg/dL (ref 1.7–2.4)

## 2015-12-17 LAB — HEPARIN LEVEL (UNFRACTIONATED): HEPARIN UNFRACTIONATED: 2.06 [IU]/mL — AB (ref 0.30–0.70)

## 2015-12-17 LAB — HEMOGLOBIN A1C
HEMOGLOBIN A1C: 5.4 % (ref 4.8–5.6)
Mean Plasma Glucose: 108 mg/dL

## 2015-12-17 LAB — APTT
APTT: 152 s — AB (ref 24–36)
APTT: 129 s — AB (ref 24–36)
aPTT: 105 seconds — ABNORMAL HIGH (ref 24–36)

## 2015-12-17 LAB — STREP PNEUMONIAE URINARY ANTIGEN: STREP PNEUMO URINARY ANTIGEN: NEGATIVE

## 2015-12-17 MED ORDER — HEPARIN (PORCINE) IN NACL 100-0.45 UNIT/ML-% IJ SOLN
750.0000 [IU]/h | INTRAMUSCULAR | Status: DC
  Administered 2015-12-19: 750 [IU]/h via INTRAVENOUS
  Filled 2015-12-17: qty 250

## 2015-12-17 MED ORDER — DEXTROSE 5 % IV SOLN
1.0000 g | Freq: Three times a day (TID) | INTRAVENOUS | Status: DC
  Administered 2015-12-17: 1 g via INTRAVENOUS
  Filled 2015-12-17 (×2): qty 1

## 2015-12-17 MED ORDER — POTASSIUM CHLORIDE 2 MEQ/ML IV SOLN
INTRAVENOUS | Status: DC
  Administered 2015-12-17 – 2015-12-18 (×3): via INTRAVENOUS
  Filled 2015-12-17 (×12): qty 1000

## 2015-12-17 MED ORDER — METRONIDAZOLE IN NACL 5-0.79 MG/ML-% IV SOLN
500.0000 mg | Freq: Three times a day (TID) | INTRAVENOUS | Status: DC
  Administered 2015-12-17 – 2015-12-21 (×12): 500 mg via INTRAVENOUS
  Filled 2015-12-17 (×15): qty 100

## 2015-12-17 MED ORDER — VANCOMYCIN HCL IN DEXTROSE 750-5 MG/150ML-% IV SOLN
750.0000 mg | Freq: Two times a day (BID) | INTRAVENOUS | Status: DC
  Administered 2015-12-17 – 2015-12-18 (×4): 750 mg via INTRAVENOUS
  Filled 2015-12-17 (×6): qty 150

## 2015-12-17 MED ORDER — ASPIRIN 300 MG RE SUPP
150.0000 mg | Freq: Every day | RECTAL | Status: DC
  Administered 2015-12-17 – 2015-12-25 (×9): 150 mg via RECTAL
  Filled 2015-12-17 (×9): qty 1

## 2015-12-17 MED ORDER — LEVOFLOXACIN IN D5W 500 MG/100ML IV SOLN
500.0000 mg | INTRAVENOUS | Status: DC
  Administered 2015-12-17 – 2015-12-22 (×6): 500 mg via INTRAVENOUS
  Filled 2015-12-17 (×6): qty 100

## 2015-12-17 NOTE — Progress Notes (Addendum)
Pharmacy Antibiotic Note  Janet Griffin is a 65 y.o. female admitted on 12/15/2015 with concern for sepsis 2/2 UTI or PNA.  Pharmacy has been consulted for Vancomycin and Metronidazole dosing in addition to Levaquin. Patient with temp 100.1 and WBC increased slightly from 16.2 to 18.7. Patient initially with AKI, now improved with SCr 1.27, CrCl ~ 5050mL/min, and UOP marginal at 0.4 mL/kg/hr.    Plan: Vancomycin 750 mg q12hr, for goal trough of 15-20 mcg/mL.  Monitor renal function, clinical picture, culture results and vancomycin trough at steady state.  Continue Levaquin 500mg  IV Q24H. D/C Azactam   Height: 5\' 7"  (170.2 cm) Weight: 162 lb 0.6 oz (73.5 kg) IBW/kg (Calculated) : 61.6  Temp (24hrs), Avg:97.7 F (36.5 C), Min:96.6 F (35.9 C), Max:98.2 F (36.8 C)   Recent Labs Lab 12/15/15 2032 12/15/15 2053 12/15/15 2337 12/16/15 0030 12/16/15 0044 12/16/15 0516 12/16/15 0728 12/16/15 1255 12/17/15 0515  WBC 16.2*  --   --   --   --   --   --   --  18.7*  CREATININE 4.67*  --   --  3.87*  --   --  2.72* 2.19* 1.27*  LATICACIDVEN  --  6.75* 9.31*  --  5.22* 5.9* 3.4*  --   --     Estimated Creatinine Clearance: 44.1 mL/min (by C-G formula based on SCr of 1.27 mg/dL (H)).    Allergies  Allergen Reactions  . Penicillins Hives and Anaphylaxis   Antimicrobials this admission:  11/4 aztreonam >>11/5 11/4 levaquin >> 11/5 vanc >>  11/5 flagyl >>   Dose adjustments this admission: n/a   Microbiology results:  11/3 BCx: ngtd 11/3 UCx: no growth  11/4 MRSA PCR: neg   Thank you for allowing pharmacy to be a part of this patient's care.  Janet Griffin, PharmD Pharmacy Resident  Pager [phone] 12/17/15 7:15 AM

## 2015-12-17 NOTE — Progress Notes (Signed)
Please see my rounding note from yesterday, we are awaiting echo results. No new recs at this time. Call with questions over the weekend.    Dominga FerryJ Kaio Kuhlman MD

## 2015-12-17 NOTE — Progress Notes (Signed)
PROGRESS NOTE    Janet Griffin  MWU:[id number] DOB: Nov 28, 1950 DOA: 12/15/2015 PCP: Keane PoliceSLADE-HARTMAN, [name], MD   Chief Complaint  Patient presents with  . Altered Mental Status    Brief Narrative:  HPI on 12/15/2015 by Dr. Michael LitterNikki Carter Janet Griffin is a 65 y.o. woman with a history of CVA with right sided weakness and dementia (she is nonverbal at baseline) who is a long term resident of a SNF.  She has been transferred to the ED for evaluation of mental status changes including increased lethargy (sleeping more), decreased PO intake, decreased eye contact with family.  She cannot verbalize specific pain complaints.  Her sister reports a history of a tooth abscess, for which the patient has completed a course of oral antibiotics but the tooth has not been pulled. Fever was documented prior to presentation.  She has had cough and increased wheezing per her sister.  The facility has reported decreased urine output, and her sister feels that the urine has an increased odor. Of note, she is also XARELTO for a history of LLE DVT.  Assessment & Plan   Severe sepsis secondary to UTI versus pneumonia -Upon admission, Noted to be febrile with leukocytosis, elevated troponin, AK I, lactic acid 9.37 -Kidney injury currently improving, lactic acid improving as well. -Continue IV fluids, broad-spectrum antibiotics with aztreonam and Levaquin, patient does have a penicillin allergy -Blood and urine cultures show no growth to date -Chest x-ray mild medial left base opacity- atelectasis vs infiltrate -Will order strep pneumonia urine antigen -Will order CXR  Acute metabolic encephalopathy -possible related to infection -poor baseline   Acute hypoxic respiratory failure -SpO2 89%, pO2 on ABG 75 -Currently on nasal canula, will attempt to wean  Elevated troponin -Likely secondary to sepsis/demand ischemia -Peaked at 1.39, currently trending downward 0.94 -Echocardiogram pending -Cardiology  consulted and appreciated- recommended medical management if echocardiogram ok, would not pursue ischemic testing  Acute kidney injury -Secondary to sepsis and dehydration -Creatinine <1 at baseline, was up to 4.67 upon admission -Currently improving, 1.27 -Continue to monitor BMP  Hypernatremia and hyperchloremia -Likely secondary to the above -Continue IVF with D5- will change to D5W -Monitor BMP  Essential Hypertension -Continue hydralazine PRN  Possible history CVA -CT head unremarkable -Reviewed prior CTs, no CVAs noted  -Per family, patient became nonverbal last year, started having left sided weakness starting about 8 months ago  History of LLE DVT -patient was on Xarelto -Continue IV heparin  Dysphagia -Per sister, due to abscessed tooth -Will continue to monitor -Spoke with sister at bedside regarding possible need for peg tube If patient does not improve  Code status -Spoke with sister who is POA, patient is a full code.    DVT Prophylaxis  heparin  Code Status: Full  Family Communication: Sister at bedside  Disposition Plan: Admitted. Continue to monitor in stepdown  Consultants Cardiology  Procedures  None  Antibiotics   Anti-infectives    Start     Dose/Rate Route Frequency Ordered Stop   12/18/15 0000  levofloxacin (LEVAQUIN) IVPB 500 mg  Status:  Discontinued     500 mg 100 mL/hr over 60 Minutes Intravenous Every 48 hours 12/16/15 0012 12/17/15 0845   12/17/15 0900  levofloxacin (LEVAQUIN) IVPB 500 mg     500 mg 100 mL/hr over 60 Minutes Intravenous Every 24 hours 12/17/15 0845     12/17/15 0800  aztreonam (AZACTAM) 1 g in dextrose 5 % 50 mL IVPB     1 g 100  mL/hr over 30 Minutes Intravenous Every 8 hours 12/17/15 0717     12/16/15 0800  aztreonam (AZACTAM) 500 mg in dextrose 5 % 50 mL IVPB  Status:  Discontinued     500 mg 100 mL/hr over 30 Minutes Intravenous Every 8 hours 12/16/15 0012 12/17/15 0717   12/16/15 0015  aztreonam (AZACTAM)  500 mg in dextrose 5 % 50 mL IVPB     500 mg 100 mL/hr over 30 Minutes Intravenous  Once 12/16/15 0012 12/16/15 0151   12/16/15 0015  levofloxacin (LEVAQUIN) IVPB 500 mg     500 mg 100 mL/hr over 60 Minutes Intravenous  Once 12/16/15 0012 12/16/15 0419      Subjective:   Janet Rout seen and examined today.  Patient is nonverbal  Objective:   Vitals:   12/16/15 2300 12/17/15 0310 12/17/15 0500 12/17/15 0700  BP: 111/68 112/69  104/73  Pulse: 79 97  86  Resp: 15 (!) 30  (!) 25  Temp: 98.2 F (36.8 C) 98 F (36.7 C)  98.8 F (37.1 C)  TempSrc: Axillary Axillary  Axillary  SpO2: 100% 100%  100%  Weight:   73.3 kg (161 lb 9.6 oz)   Height:        Intake/Output Summary (Last 24 hours) at 12/17/15 1050 Last data filed at 12/17/15 0910  Gross per 24 hour  Intake          1338.33 ml  Output              810 ml  Net           528.33 ml   Filed Weights   12/15/15 2004 12/16/15 0500 12/17/15 0500  Weight: 81.6 kg (180 lb) 73.5 kg (162 lb 0.6 oz) 73.3 kg (161 lb 9.6 oz)    Exam  General: Well developed, well nourished, NAD, appears stated age  HEENT: NCAT,  mucous membranes moist  Cardiovascular: S1 S2 auscultated, no murmurs, RRR  Respiratory: Clear to auscultation bilaterally with equal chest rise  Abdomen: Soft, nontender, nondistended, + bowel sounds, well-healed abd scar  Extremities: warm dry without cyanosis clubbing or edema  Neuro: Cannot assess  Psych: Cannot assess   Data Reviewed: I have personally reviewed following labs and imaging studies  CBC:  Recent Labs Lab 12/15/15 2032 12/17/15 0515  WBC 16.2* 18.7*  NEUTROABS 14.2*  --   HGB 17.8* 13.3  HCT 55.5* 42.4  MCV 101.1* 100.7*  PLT 284 205   Basic Metabolic Panel:  Recent Labs Lab 12/15/15 2032 12/16/15 0030 12/16/15 0728 12/16/15 1255 12/17/15 0515 12/17/15 0852  NA 167* 167* 161* 162* 161*  --   K 2.7* 3.0* 3.8 2.9* 2.8*  --   CL 120* 126* 126* >130* 129*  --   CO2 25  23 22 22 23   --   GLUCOSE 224* 222* 297* 211* 138*  --   BUN 94* 92* 80* 71* 44*  --   CREATININE 4.67* 3.87* 2.72* 2.19* 1.27*  --   CALCIUM 9.3 8.3* 7.9* 8.5* 8.4*  --   MG  --   --   --   --   --  2.2   GFR: Estimated Creatinine Clearance: 44.1 mL/min (by C-G formula based on SCr of 1.27 mg/dL (H)). Liver Function Tests:  Recent Labs Lab 12/15/15 2032 12/16/15 0030  AST 112* 94*  ALT 156* 127*  ALKPHOS 77 62  BILITOT 3.8* 2.9*  PROT 7.8 6.9  ALBUMIN 3.9 3.5   No results  for input(s): LIPASE, AMYLASE in the last 168 hours. No results for input(s): AMMONIA in the last 168 hours. Coagulation Profile:  Recent Labs Lab 12/16/15 0328  INR 1.40   Cardiac Enzymes:  Recent Labs Lab 12/16/15 0030 12/16/15 0328 12/16/15 0516 12/16/15 0728  TROPONINI 1.39* 1.28* 0.97* 0.94*   BNP (last 3 results) No results for input(s): PROBNP in the last 8760 hours. HbA1C: No results for input(s): HGBA1C in the last 72 hours. CBG:  Recent Labs Lab 12/16/15 1604 12/16/15 1912 12/16/15 2314 12/17/15 0352 12/17/15 0810  GLUCAP 98 128* 86 143* 177*   Lipid Profile: No results for input(s): CHOL, HDL, LDLCALC, TRIG, CHOLHDL, LDLDIRECT in the last 72 hours. Thyroid Function Tests:  Recent Labs  12/16/15 0328  TSH 0.906   Anemia Panel: No results for input(s): VITAMINB12, FOLATE, FERRITIN, TIBC, IRON, RETICCTPCT in the last 72 hours. Urine analysis:    Component Value Date/Time   COLORURINE AMBER (A) 12/15/2015 2216   APPEARANCEUR CLOUDY (A) 12/15/2015 2216   LABSPEC 1.013 12/15/2015 2216   PHURINE 5.0 12/15/2015 2216   GLUCOSEU NEGATIVE 12/15/2015 2216   HGBUR LARGE (A) 12/15/2015 2216   BILIRUBINUR SMALL (A) 12/15/2015 2216   KETONESUR NEGATIVE 12/15/2015 2216   PROTEINUR NEGATIVE 12/15/2015 2216   UROBILINOGEN 2.0 (H) 09/08/2012 2138   NITRITE NEGATIVE 12/15/2015 2216   LEUKOCYTESUR TRACE (A) 12/15/2015 2216   Sepsis  Labs: @LABRCNTIP (procalcitonin:4,lacticidven:4)  ) Recent Results (from the past 240 hour(s))  Blood Culture (routine x 2)     Status: None (Preliminary result)   Collection Time: 12/15/15  8:32 PM  Result Value Ref Range Status   Specimen Description BLOOD LEFT ANTECUBITAL  Final   Special Requests BOTTLES DRAWN AEROBIC AND ANAEROBIC 5CC  Final   Culture NO GROWTH 2 DAYS  Final   Report Status PENDING  Incomplete  Blood Culture (routine x 2)     Status: None (Preliminary result)   Collection Time: 12/15/15  9:10 PM  Result Value Ref Range Status   Specimen Description BLOOD RIGHT HAND  Final   Special Requests IN PEDIATRIC BOTTLE 4CC  Final   Culture NO GROWTH 2 DAYS  Final   Report Status PENDING  Incomplete  Urine culture     Status: None   Collection Time: 12/15/15 10:16 PM  Result Value Ref Range Status   Specimen Description URINE, CATHETERIZED  Final   Special Requests NONE  Final   Culture NO GROWTH  Final   Report Status 12/17/2015 FINAL  Final  MRSA PCR Screening     Status: None   Collection Time: 12/16/15  6:30 AM  Result Value Ref Range Status   MRSA by PCR NEGATIVE NEGATIVE Final    Comment:        The GeneXpert MRSA Assay (FDA approved for NASAL specimens only), is one component of a comprehensive MRSA colonization surveillance program. It is not intended to diagnose MRSA infection nor to guide or monitor treatment for MRSA infections.       Radiology Studies: Ct Head Wo Contrast  Result Date: 12/16/2015 CLINICAL DATA:  Encephalopathy and dementia EXAM: CT HEAD WITHOUT CONTRAST TECHNIQUE: Contiguous axial images were obtained from the base of the skull through the vertex without intravenous contrast. COMPARISON:  Head CT 05/15/2014 FINDINGS: Brain: There is advanced, diffuse supratentorial volume loss with associated ex vacuo dilatation of the lateral and third ventricles. There is no acute intracranial hemorrhage. No CT evidence of acute cortical  infarct. No midline shift or  mass lesion. There is periventricular hypoattenuation compatible with chronic microvascular disease. Vascular: No hyperdense vessel or unexpected calcification. Skull: Normal. Negative for fracture or focal lesion. Sinuses/Orbits: The visualized portions of the paranasal sinuses and mastoid air cells are free of fluid. No advanced mucosal thickening. The visualized orbits are normal. Other: None. IMPRESSION: 1. Advanced atrophy with findings of chronic microvascular disease. 2. No acute intracranial abnormality. Electronically Signed   By: Deatra RobinsonKevin  Herman M.D.   On: 12/16/2015 01:25   Dg Chest Port 1 View  Result Date: 12/15/2015 CLINICAL DATA:  Sepsis EXAM: PORTABLE CHEST 1 VIEW COMPARISON:  09/08/2012 FINDINGS: Small amount of left basilar atelectasis versus mild medial infiltrate. No effusion. Heart size normal. No pneumothorax. IMPRESSION: Mild medial left base opacity could relate to mild atelectasis versus small infiltrate. Electronically Signed   By: Jasmine PangKim  Fujinaga M.D.   On: 12/15/2015 21:06   Ct Renal Stone Study  Result Date: 12/16/2015 CLINICAL DATA:  Acute kidney injury. Concern for sepsis and urinary tract infection. EXAM: CT ABDOMEN AND PELVIS WITHOUT CONTRAST TECHNIQUE: Multidetector CT imaging of the abdomen and pelvis was performed following the standard protocol without IV contrast. COMPARISON:  Pelvic radiograph 09/08/2012 FINDINGS: Lower chest: Respiratory motion somewhat degrades evaluation of the lung bases. There is consolidation versus atelectasis in the left lung base. Milder right basilar atelectasis. No pleural effusion. Hepatobiliary: Normal hepatic size and contours. No perihepatic ascites. No intra- or extrahepatic biliary dilatation. The gallbladder is surgically absent. Pancreas: Normal pancreatic contours. No peripancreatic fluid collection or pancreatic ductal dilatation. Spleen: Small but otherwise normal. Adrenals/Urinary Tract: Normal adrenal  glands. No hydronephrosis or nephrolithiasis. No abnormal perinephric stranding. No ureteral obstruction. Stomach/Bowel: No abnormal bowel dilatation. No bowel wall thickening or adjacent fat stranding to indicate acute inflammation. No abdominal fluid collection. Normal appendix. Vascular/Lymphatic: Atherosclerotic calcification of the non aneurysmal abdominal aorta. No abdominal or pelvic adenopathy. Reproductive: Hyperdense lesion at the left uterine fundus measures 3.5 cm. Ovaries are not clearly identified. No free fluid in the pelvis. Musculoskeletal: Right greater than left hip osteoarthrosis. Lower lumbar facet arthrosis and osteophytosis. Small amounts of right lower quadrant subcutaneous gas. Question recent subcutaneous injection (such as Lovenox or insulin). Other: No contributory non-categorized findings. IMPRESSION: 1. No obstructive uropathy or perinephric stranding. Assessment for pyelonephritis is limited in the absence of IV contrast. 2. Consolidative opacity in the left lower lobe, infection versus atelectasis. 3. Subcutaneous gas in the right lower quadrant is likely iatrogenic. Correlate for recent subcutaneous injection. 4. Aortic atherosclerosis. Electronically Signed   By: Deatra RobinsonKevin  Herman M.D.   On: 12/16/2015 01:10     Scheduled Meds: . aspirin  150 mg Rectal Daily  . aztreonam  1 g Intravenous Q8H  . famotidine (PEPCID) IV  20 mg Intravenous Q24H  . insulin aspart  0-15 Units Subcutaneous Q4H  . levofloxacin (LEVAQUIN) IV  500 mg Intravenous Q24H  . sodium chloride flush  3 mL Intravenous Q12H   Continuous Infusions: . dextrose 5 % 1,000 mL with potassium chloride 80 mEq infusion 100 mL/hr at 12/17/15 0909  . heparin 1,000 Units/hr (12/17/15 0103)     LOS: 2 days   Time Spent in minutes   30 minutes  Dejane Scheibe D.O. on 12/17/2015 at 10:50 AM  Between 7am to 7pm - Pager - [phone]  After 7pm go to [email or web address] - password [email or web address]  And look for the night  coverage person covering for me after hours  [facility] Office  [phone]

## 2015-12-17 NOTE — Progress Notes (Signed)
ANTICOAGULATION CONSULT NOTE - Follow Up Consult  Pharmacy Consult for heparin Indication: h/o DVT  Labs:  Recent Labs  12/15/15 2032  12/16/15 0030 12/16/15 0328 12/16/15 0516 12/16/15 0728 12/16/15 1255 12/16/15 2342  HGB 17.8*  --   --   --   --   --   --   --   HCT 55.5*  --   --   --   --   --   --   --   PLT 284  --   --   --   --   --   --   --   APTT  --   --   --  <20*  --   --   --  129*  LABPROT  --   --   --  17.3*  --   --   --   --   INR  --   --   --  1.40  --   --   --   --   HEPARINUNFRC  --   --   --   --   --   --   --  2.06*  CREATININE 4.67*  --  3.87*  --   --  2.72* 2.19*  --   TROPONINI  --   < > 1.39* 1.28* 0.97* 0.94*  --   --   < > = values in this interval not displayed.   Assessment: 65yo female above goal on heparin with initial dosing while Eliquis on hold; unfortunately pt is a difficult stick and had to be stuck multiple times so it is unclear whether labs were drawn near IV infusion site.  Goal of Therapy:  aPTT 66-102 seconds   Plan:  Will decrease heparin gtt by 3 units/kg/hr to 1000 units/hr and check PTT in 8hr.  Vernard GamblesVeronda Charlise Giovanetti, PharmD, BCPS  12/17/2015,1:04 AM

## 2015-12-17 NOTE — Progress Notes (Signed)
ANTICOAGULATION CONSULT NOTE - Follow Up Consult  Pharmacy Consult for heparin Indication: h/o DVT  Labs:  Recent Labs  12/15/15 2032  12/16/15 0328 12/16/15 0516 12/16/15 0728 12/16/15 1255 12/16/15 2342 12/17/15 0515 12/17/15 0852  HGB 17.8*  --   --   --   --   --   --  13.3  --   HCT 55.5*  --   --   --   --   --   --  42.4  --   PLT 284  --   --   --   --   --   --  205  --   APTT  --   --  <20*  --   --   --  129*  --  152*  LABPROT  --   --  17.3*  --   --   --   --   --   --   INR  --   --  1.40  --   --   --   --   --   --   HEPARINUNFRC  --   --   --   --   --   --  2.06*  --   --   CREATININE 4.67*  < >  --   --  2.72* 2.19*  --  1.27*  --   TROPONINI  --   < > 1.28* 0.97* 0.94*  --   --   --   --   < > = values in this interval not displayed.   Assessment: 65yo female above goal on heparin with initial dosing while Eliquis on hold. Following aPTT today with daily heparin levels ordered until they begin to correlate. APTT result is supratherapeutic at 152 despite dose reduction 8 hours ago. Per RN, labs drawn in left hand and heparin running in right foot. Due to high level, will hold gtt for one hour (1030-1130) and resume at reduced dose. No signs or symptoms of bleeding per RN and upon my review of patient.   Goal of Therapy:  aPTT 66-102 seconds   Plan:  Will hold heparin gtt x1 hr Resume heparin gtt in 1 hr at 800 units/hr  Check PTT in 8hr Daily aPTT and heparin levels Monitor for s/s bleeding   York CeriseKatherine Cook, PharmD Pharmacy Resident  Pager [phone] 12/17/15 10:32 AM

## 2015-12-17 NOTE — Progress Notes (Signed)
ANTICOAGULATION CONSULT NOTE - Follow Up Consult  Pharmacy Consult for Heparin Indication: h/o DVT  Allergies  Allergen Reactions  . Penicillins Hives and Anaphylaxis    Patient Measurements: Height: 5\' 7"  (170.2 cm) Weight: 161 lb 9.6 oz (73.3 kg) IBW/kg (Calculated) : 61.6   Vital Signs: Temp: 98.1 F (36.7 C) (11/05 1909) Temp Source: Oral (11/05 1909) BP: 115/80 (11/05 1909) Pulse Rate: 101 (11/05 1909)  Labs:  Recent Labs  12/15/15 2032  12/16/15 0328 12/16/15 0516 12/16/15 0728 12/16/15 1255 12/16/15 2342 12/17/15 0515 12/17/15 0852 12/17/15 2018  HGB 17.8*  --   --   --   --   --   --  13.3  --   --   HCT 55.5*  --   --   --   --   --   --  42.4  --   --   PLT 284  --   --   --   --   --   --  205  --   --   APTT  --   < > <20*  --   --   --  129*  --  152* 105*  LABPROT  --   --  17.3*  --   --   --   --   --   --   --   INR  --   --  1.40  --   --   --   --   --   --   --   HEPARINUNFRC  --   --   --   --   --   --  2.06*  --   --   --   CREATININE 4.67*  < >  --   --  2.72* 2.19*  --  1.27*  --   --   TROPONINI  --   < > 1.28* 0.97* 0.94*  --   --   --   --   --   < > = values in this interval not displayed.  Estimated Creatinine Clearance: 44.1 mL/min (by C-G formula based on SCr of 1.27 mg/dL (H)).   Medications:  Heparin @ 800 units/hr  Assessment: 65yof continues on heparin for h/o DVT while eliquis on hold. APTT is now just slightly above goal at 105 seconds after holding and decreasing rate earlier today. No bleeding.  Goal of Therapy:  Heparin level 0.3-0.7 aPTT 66-102 seconds Monitor platelets by anticoagulation protocol: Yes   Plan:  1) Decrease heparin to 750 units 2) Daily heparin level and APTT  Fredrik RiggerMarkle, Cherith Tewell Sue 12/17/2015,9:40 PM

## 2015-12-18 ENCOUNTER — Inpatient Hospital Stay (HOSPITAL_COMMUNITY): Payer: Medicare Other

## 2015-12-18 DIAGNOSIS — R7889 Finding of other specified substances, not normally found in blood: Secondary | ICD-10-CM

## 2015-12-18 LAB — CBC
HEMATOCRIT: 36.4 % (ref 36.0–46.0)
HEMOGLOBIN: 11.2 g/dL — AB (ref 12.0–15.0)
MCH: 30.9 pg (ref 26.0–34.0)
MCHC: 30.8 g/dL (ref 30.0–36.0)
MCV: 100.3 fL — ABNORMAL HIGH (ref 78.0–100.0)
Platelets: 181 10*3/uL (ref 150–400)
RBC: 3.63 MIL/uL — AB (ref 3.87–5.11)
RDW: 13.8 % (ref 11.5–15.5)
WBC: 18.1 10*3/uL — ABNORMAL HIGH (ref 4.0–10.5)

## 2015-12-18 LAB — ECHOCARDIOGRAM COMPLETE
HEIGHTINCHES: 67 in
Weight: 2645.52 oz

## 2015-12-18 LAB — BASIC METABOLIC PANEL
Anion gap: 8 (ref 5–15)
BUN: 18 mg/dL (ref 6–20)
CHLORIDE: 125 mmol/L — AB (ref 101–111)
CO2: 24 mmol/L (ref 22–32)
Calcium: 8.1 mg/dL — ABNORMAL LOW (ref 8.9–10.3)
Creatinine, Ser: 0.9 mg/dL (ref 0.44–1.00)
GFR calc non Af Amer: >60 mL/min (ref >60)
Glucose, Bld: 175 mg/dL — ABNORMAL HIGH (ref 65–99)
POTASSIUM: 3.3 mmol/L — AB (ref 3.5–5.1)
SODIUM: 157 mmol/L — AB (ref 135–145)

## 2015-12-18 LAB — GLUCOSE, CAPILLARY
GLUCOSE-CAPILLARY: 185 mg/dL — AB (ref 65–99)
GLUCOSE-CAPILLARY: 83 mg/dL (ref 65–99)
Glucose-Capillary: 107 mg/dL — ABNORMAL HIGH (ref 65–99)
Glucose-Capillary: 117 mg/dL — ABNORMAL HIGH (ref 65–99)
Glucose-Capillary: 119 mg/dL — ABNORMAL HIGH (ref 65–99)
Glucose-Capillary: 129 mg/dL — ABNORMAL HIGH (ref 65–99)

## 2015-12-18 LAB — APTT: APTT: 59 s — AB (ref 24–36)

## 2015-12-18 LAB — HEPARIN LEVEL (UNFRACTIONATED): Heparin Unfractionated: 0.56 IU/mL (ref 0.30–0.70)

## 2015-12-18 MED ORDER — PERFLUTREN LIPID MICROSPHERE
1.0000 mL | INTRAVENOUS | Status: AC | PRN
  Administered 2015-12-18: 2 mL via INTRAVENOUS
  Filled 2015-12-18: qty 10

## 2015-12-18 NOTE — Clinical Social Work Note (Signed)
Clinical Social Work Assessment  Patient Details  Name: Janet Griffin MRN: [id number] Date of Birth: 16-Jan-1951  Date of referral:  12/18/15               Reason for consult:  Facility Placement, Discharge Planning                Permission sought to share information with:  Facility Medical sales representativeContact Representative, Family Supports Permission granted to share information::  Yes, Verbal Permission Granted  Name::     Tish Menrina Brown  [facility]  Relationship::  Sister  Contact Information:  [phone]  Housing/Transportation Living arrangements for the past 2 months:  Skilled Building surveyorursing Facility Source of Information:  Medical Team, Power of Attorney Patient Interpreter Needed:  None Criminal Activity/Legal Involvement Pertinent to Current Situation/Hospitalization:  No - Comment as needed Significant Relationships:  Siblings, Spouse Lives with:  Facility Resident Do you feel safe going back to the place where you live?  Yes Need for family participation in patient care:  Yes (Comment)  Care giving concerns:  [facility].   Social Worker assessment / plan:  Patient mute and not fully oriented. CSW contacted patient's sister/HCPOA, Tish Menrina Brown. CSW introduced role and explained that discharge planning would be discussed. Ms. Manson PasseyBrown confirmed that [facility] and the plan is for her to return. Per Ms. Manson PasseyBrown, the MD told her patient would not discharge for several more days. No further concerns. CSW encouraged Ms. Manson PasseyBrown to contact CSW as needed. CSW will continue to follow patient and her family for support and facilitate discharge [facility] once medically stable.  Employment status:  Unemployed Health and safety inspectornsurance information:  [place]  Not assessed at this time Information / Referral to community resources:  Skilled Nursing Facility  Patient/Family's Response to care:  Patient not fully oriented and  is mute. Patient's sister agreeable to [facility]. Patient's family supportive and involved in patient's care. Ms. Manson PasseyBrown appreciated social work intervention.  Patient/Family's Understanding of and Emotional Response to Diagnosis, Current Treatment, and Prognosis:  Patient not fully oriented and is mute. Patient's sister understands that [facility] once discharged. Patient's sister appears happy with hospital care.  Emotional Assessment Appearance:  Appears stated age Attitude/Demeanor/Rapport:  Unable to Assess Affect (typically observed):  Unable to Assess Orientation:    Alcohol / Substance use:  Never Used Psych involvement (Current and /or in the community):  No (Comment)  Discharge Needs  Concerns to be addressed:  Care Coordination Readmission within the last 30 days:  No Current discharge risk:  Cognitively Impaired Barriers to Discharge:  No Barriers Identified   Margarito LinerSarah C Amman Bartel, LCSW 12/18/2015, 11:30 AM

## 2015-12-18 NOTE — Progress Notes (Signed)
ANTICOAGULATION CONSULT NOTE - Follow Up Consult  Pharmacy Consult for Heparin Indication: h/o DVT  Allergies  Allergen Reactions  . Penicillins Hives and Anaphylaxis    Patient Measurements: Height: 5\' 7"  (170.2 cm) Weight: 165 lb 5.5 oz (75 kg) IBW/kg (Calculated) : 61.6 Heparin Dosing Weight:  75 kg  Vital Signs: Temp: 99.5 F (37.5 C) (11/06 0931) Temp Source: Axillary (11/06 0931) BP: 109/82 (11/06 1000) Pulse Rate: 89 (11/06 1000)  Labs:  Recent Labs  12/15/15 2032  12/16/15 0328 12/16/15 0516 12/16/15 0728 12/16/15 1255 12/16/15 2342 12/17/15 0515 12/17/15 0852 12/17/15 2018 12/18/15 0409  HGB 17.8*  --   --   --   --   --   --  13.3  --   --  11.2*  HCT 55.5*  --   --   --   --   --   --  42.4  --   --  36.4  PLT 284  --   --   --   --   --   --  205  --   --  181  APTT  --   < > <20*  --   --   --  129*  --  152* 105* 59*  LABPROT  --   --  17.3*  --   --   --   --   --   --   --   --   INR  --   --  1.40  --   --   --   --   --   --   --   --   HEPARINUNFRC  --   --   --   --   --   --  2.06*  --   --   --  0.56  CREATININE 4.67*  < >  --   --  2.72* 2.19*  --  1.27*  --   --  0.90  TROPONINI  --   < > 1.28* 0.97* 0.94*  --   --   --   --   --   --   < > = values in this interval not displayed.  Estimated Creatinine Clearance: 67.7 mL/min (by C-G formula based on SCr of 0.9 mg/dL).    Assessment:  Anticoag: On Eliquis PTA for h/o DVT (LD 11/3), per cards currently risk of bleeding outweighs continuing anticoag. Dr. Darlin CocoMikhail>>start heparin gtt for hx DVT. Has been >24 hrs since last dose of Eliquis. She is aware of cardiology concern for bleeding (per their note) and would like to proceed with heparin gtt. --aPTT 59, HL 0.56 in goal. Hgb 11.2 down. Plts 181 declining. No bleeding noted per RN.  Goal of Therapy:  Heparin level 0.3-0.7 units/ml Monitor platelets by anticoagulation protocol: Yes   Plan:  IV heparin 750 units/hr Daily HL and CBC (d/c  aPTTs)  Janet Griffin, PharmD, BCPS Clinical Staff Pharmacist Pager [phone]  Misty Stanleyobertson, Janet Griffin 12/18/2015,10:51 AM

## 2015-12-18 NOTE — Evaluation (Signed)
Physical Therapy Evaluation Patient Details Name: Darrel Baroni MRN: [id number] DOB: 1950-04-13 Today's Date: 12/18/2015   History of Present Illness  Patient is a 65 y/o female with hx of CVA with residual right sided weakness, DVT LLE and dementia who is a long term resident at SNF presents with AMS, increased lethargy and decreased PO intake. Found to have Acute metabolic encephalopathy secondary to AKI in the setting of apparent extreme dehydration and possible UTI. Sepsis protocol initiated. CXR-mild medial left base opacity- atelectasis vs infiltrate  Clinical Impression  Patient presents with impaired tone RUE/LE, impaired attention, right inattention vs neglect, inability to follow commands, pain and impaired mobility. Pt is a long term resident at [facility] and is total A for all care. Per sister, pt working with PT PTA. Uses lift to transfer to w/c. Pt nonverbal at baseline. Sister concerned about the pt having something in her mouth- RN aware. Recommend using maxi move to transfer pt to chair when appropriate and position changes daily to prevent further skin breakdown and bed sores. Will defer further PT follow up to SNF. Discharge from therapy.    Follow Up Recommendations SNF (return to [facility])    Equipment Recommendations  None recommended by PT    Recommendations for Other Services Speech consult     Precautions / Restrictions Precautions Precautions: Fall Restrictions Weight Bearing Restrictions: No      Mobility  Bed Mobility Overal bed mobility: +2 for physical assistance             General bed mobility comments: Attempted to reposition pt in bed with neutral cervical spine.   Transfers Overall transfer level:  (Deferred as pt bedbound at baseline.)                  Ambulation/Gait                Stairs            Wheelchair Mobility    Modified Rankin (Stroke Patients Only) Modified Rankin (Stroke Patients  Only) Pre-Morbid Rankin Score: Severe disability Modified Rankin: Severe disability     Balance Overall balance assessment:  (NA)                                           Pertinent Vitals/Pain Pain Assessment: Faces Faces Pain Scale: Hurts little more Pain Location: difficult to distinguish Pain Descriptors / Indicators: Grimacing;Other (Comment) (teeth grinding) Pain Intervention(s): Monitored during session;Premedicated before session    Hunter expects to be discharged to:: Skilled nursing facility [facility])                      Prior Function Level of Independence: Needs assistance   Gait / Transfers Assistance Needed: Pt is bedbound. Uses lift to transfer to chair.  ADL's / Homemaking Assistance Needed: Total A for all care        Hand Dominance        Extremity/Trunk Assessment   Upper Extremity Assessment:  (Able to squeeze hands on left but not on command. Good grip)           Lower Extremity Assessment: Difficult to assess due to impaired cognition RLE Deficits / Details: Increased tone throughout RLE; swelling present. LLE Deficits / Details: Difficult to assess as pt resisting at times. Increased tone.  Communication   Communication: Expressive difficulties;Receptive difficulties (non verbal)  Cognition Arousal/Alertness: Lethargic;Suspect due to medications Behavior During Therapy: Flat affect Overall Cognitive Status: Difficult to assess Area of Impairment: Following commands       Following Commands:  (does not follow any commands. )       General Comments: right inattention vs neglect. Grinds teeth and moans.    General Comments      Exercises General Exercises - Lower Extremity Heel Slides: PROM;Both;10 reps;Supine Hip ABduction/ADduction: Both;10 reps;PROM;Supine Hip Flexion/Marching: PROM;Both;10 reps;Supine Other Exercises Other Exercises: Soft tissue on cervical  musculature with stretching of traps   Assessment/Plan    PT Assessment All further PT needs can be met in the next venue of care  PT Problem List Decreased strength;Decreased mobility;Impaired tone;Decreased range of motion;Decreased activity tolerance;Decreased cognition;Decreased skin integrity;Pain;Decreased balance          PT Treatment Interventions      PT Goals (Current goals can be found in the Care Plan section)  Acute Rehab PT Goals Patient Stated Goal: none stated PT Goal Formulation: All assessment and education complete, DC therapy    Frequency     Barriers to discharge        Co-evaluation               End of Session   Activity Tolerance: Patient limited by pain;Other (comment) (inability to follow commands/participate) Patient left: in bed;with call bell/phone within reach;with family/visitor present;with bed alarm set;Other (comment) (Prevalon boots) Nurse Communication: Need for lift equipment         Time: [phone] PT Time Calculation (min) (ACUTE ONLY): 17 min   Charges:   PT Evaluation $PT Eval Moderate Complexity: 1 Procedure     PT G Codes:        Arion Shankles A Tamey Wanek 12/18/2015, 4:11 PM Wray Kearns, [place], DPT [phone]

## 2015-12-18 NOTE — Clinical Social Work Note (Addendum)
CSW acknowledges SNF consult. Patient in need of PT order/eval.  Charlynn CourtSarah Araeya Lamb, CSW [phone]  11:11 am CSW notified in progression that patient is from [facility]. CSW called and confirmed with admissions coordinator. Patient is a long-term resident.  Charlynn CourtSarah Emilie Carp, CSW [phone]

## 2015-12-18 NOTE — Progress Notes (Signed)
  Echocardiogram 2D Echocardiogram has been performed.  Leta JunglingCooper, Lenee Franze M 12/18/2015, 10:40 AM

## 2015-12-18 NOTE — Progress Notes (Signed)
PROGRESS NOTE    Janet Griffin Sprowl  NWG:[id number] DOB: 01-03-1951 DOA: 12/15/2015 PCP: Keane PoliceSLADE-HARTMAN, [name], MD   Chief Complaint  Patient presents with  . Altered Mental Status    Brief Narrative:  HPI on 12/15/2015 by Dr. Michael LitterNikki Carter Janet Griffin Street is a 65 y.o. woman with a history of CVA with right sided weakness and dementia (she is nonverbal at baseline) who is a long term resident of a SNF.  She has been transferred to the ED for evaluation of mental status changes including increased lethargy (sleeping more), decreased PO intake, decreased eye contact with family.  She cannot verbalize specific pain complaints.  Her sister reports a history of a tooth abscess, for which the patient has completed a course of oral antibiotics but the tooth has not been pulled. Fever was documented prior to presentation.  She has had cough and increased wheezing per her sister.  The facility has reported decreased urine output, and her sister feels that the urine has an increased odor. Of note, she is also XARELTO for a history of LLE DVT.  Assessment & Plan   Severe sepsis secondary to ?UTI versus pneumonia -Upon admission, Noted to be febrile with leukocytosis, elevated troponin, AK I, lactic acid 9.37 -Kidney injury currently improving, lactic acid improving as well. -Continue IV fluids, broad-spectrum antibiotics with aztreonam and Levaquin, patient does have a penicillin allergy -Blood and urine cultures show no growth to date -Chest x-ray 11/3 mild medial left base opacity- atelectasis vs infiltrate -Strep pneumonia urine antigen negative -Repeat CXR 11/5: unremarkable for infection -Patient had a "temperature of 100.21F" today, however was under 3 blankets- temperature unreliable.   Acute metabolic encephalopathy -possible related to infection -poor baseline   Acute hypoxic respiratory failure -SpO2 89%, pO2 on ABG 75 -weaned off of nasal canula, currently O2 sats in 100s on room  air  Elevated troponin -Likely secondary to sepsis/demand ischemia -Peaked at 1.39, currently trending downward 0.94 -Echocardiogram pending -Cardiology consulted and appreciated- recommended medical management if echocardiogram ok, would not pursue ischemic testing  Acute kidney injury -Secondary to sepsis and dehydration -Creatinine <1 at baseline, was up to 4.67 upon admission -Currently improving,0.9 -Continue to monitor BMP  Hypernatremia and hyperchloremia -Likely secondary to the above, improving slowly -Continue IVF -Monitor BMP  Essential Hypertension -Continue hydralazine PRN  Possible history CVA -CT head unremarkable -Reviewed prior CTs, no CVAs noted  -Per family, patient became nonverbal last year, started having left sided weakness starting about 8 months ago  History of LLE DVT -patient was on Xarelto -Continue IV heparin  Dysphagia -Per sister, due to abscessed tooth -Will continue to monitor -Spoke with sister at bedside regarding possible need for peg tube If patient does not improve  Hypokalemia -Continue replacement and monitor BMP  Code status -Spoke with sister who is POA, patient is a full code.    DVT Prophylaxis  heparin  Code Status: Full  Family Communication: None at bedside  Disposition Plan: Admitted. Continue to monitor in stepdown  Consultants Cardiology  Procedures  None  Antibiotics   Anti-infectives    Start     Dose/Rate Route Frequency Ordered Stop   12/18/15 0000  levofloxacin (LEVAQUIN) IVPB 500 mg  Status:  Discontinued     500 mg 100 mL/hr over 60 Minutes Intravenous Every 48 hours 12/16/15 0012 12/17/15 0845   12/17/15 1230  vancomycin (VANCOCIN) IVPB 750 mg/150 ml premix     750 mg 150 mL/hr over 60 Minutes Intravenous Every 12 hours  12/17/15 1213     12/17/15 1215  metroNIDAZOLE (FLAGYL) IVPB 500 mg     500 mg 100 mL/hr over 60 Minutes Intravenous Every 8 hours 12/17/15 1213     12/17/15 0900   levofloxacin (LEVAQUIN) IVPB 500 mg     500 mg 100 mL/hr over 60 Minutes Intravenous Every 24 hours 12/17/15 0845     12/17/15 0800  aztreonam (AZACTAM) 1 g in dextrose 5 % 50 mL IVPB  Status:  Discontinued     1 g 100 mL/hr over 30 Minutes Intravenous Every 8 hours 12/17/15 0717 12/17/15 1213   12/16/15 0800  aztreonam (AZACTAM) 500 mg in dextrose 5 % 50 mL IVPB  Status:  Discontinued     500 mg 100 mL/hr over 30 Minutes Intravenous Every 8 hours 12/16/15 0012 12/17/15 0717   12/16/15 0015  aztreonam (AZACTAM) 500 mg in dextrose 5 % 50 mL IVPB     500 mg 100 mL/hr over 30 Minutes Intravenous  Once 12/16/15 0012 12/16/15 0151   12/16/15 0015  levofloxacin (LEVAQUIN) IVPB 500 mg     500 mg 100 mL/hr over 60 Minutes Intravenous  Once 12/16/15 0012 12/16/15 0419      Subjective:   Janet Rout seen and examined today.  Patient is nonverbal  Objective:   Vitals:   12/18/15 0304 12/18/15 0500 12/18/15 0700 12/18/15 0800  BP: 114/70  (!) 88/65 98/68  Pulse: 89  84 81  Resp: (!) 25  (!) 22 (!) 30  Temp: 98.9 F (37.2 C)     TempSrc: Axillary     SpO2: 100%  100% 100%  Weight:  75 kg (165 lb 5.5 oz)    Height:        Intake/Output Summary (Last 24 hours) at 12/18/15 4098 Last data filed at 12/17/15 2311  Gross per 24 hour  Intake                0 ml  Output              670 ml  Net             -670 ml   Filed Weights   12/16/15 0500 12/17/15 0500 12/18/15 0500  Weight: 73.5 kg (162 lb 0.6 oz) 73.3 kg (161 lb 9.6 oz) 75 kg (165 lb 5.5 oz)    Exam (No change)  General: Well developed, well nourished, NAD, appears stated age  HEENT: NCAT,  mucous membranes moist  Cardiovascular: S1 S2 auscultated, no murmurs, RRR  Respiratory: Clear to auscultation bilaterally with equal chest rise  Abdomen: Soft, nontender, nondistended, + bowel sounds, well-healed abd scar  Extremities: warm dry without cyanosis clubbing or edema  Neuro: Cannot assess  Psych: Cannot  assess   Data Reviewed: I have personally reviewed following labs and imaging studies  CBC:  Recent Labs Lab 12/15/15 2032 12/17/15 0515 12/18/15 0409  WBC 16.2* 18.7* 18.1*  NEUTROABS 14.2*  --   --   HGB 17.8* 13.3 11.2*  HCT 55.5* 42.4 36.4  MCV 101.1* 100.7* 100.3*  PLT 284 205 181   Basic Metabolic Panel:  Recent Labs Lab 12/16/15 0030 12/16/15 0728 12/16/15 1255 12/17/15 0515 12/17/15 0852 12/18/15 0409  NA 167* 161* 162* 161*  --  157*  K 3.0* 3.8 2.9* 2.8*  --  3.3*  CL 126* 126* >130* 129*  --  125*  CO2 23 22 22 23   --  24  GLUCOSE 222* 297* 211* 138*  --  175*  BUN 92* 80* 71* 44*  --  18  CREATININE 3.87* 2.72* 2.19* 1.27*  --  0.90  CALCIUM 8.3* 7.9* 8.5* 8.4*  --  8.1*  MG  --   --   --   --  2.2  --    GFR: Estimated Creatinine Clearance: 67.7 mL/min (by C-G formula based on SCr of 0.9 mg/dL). Liver Function Tests:  Recent Labs Lab 12/15/15 2032 12/16/15 0030  AST 112* 94*  ALT 156* 127*  ALKPHOS 77 62  BILITOT 3.8* 2.9*  PROT 7.8 6.9  ALBUMIN 3.9 3.5   No results for input(s): LIPASE, AMYLASE in the last 168 hours. No results for input(s): AMMONIA in the last 168 hours. Coagulation Profile:  Recent Labs Lab 12/16/15 0328  INR 1.40   Cardiac Enzymes:  Recent Labs Lab 12/16/15 0030 12/16/15 0328 12/16/15 0516 12/16/15 0728  TROPONINI 1.39* 1.28* 0.97* 0.94*   BNP (last 3 results) No results for input(s): PROBNP in the last 8760 hours. HbA1C:  Recent Labs  12/16/15 0649  HGBA1C 5.4   CBG:  Recent Labs Lab 12/17/15 1619 12/17/15 1905 12/17/15 2308 12/18/15 0305 12/18/15 0720  GLUCAP 121* 89 101* 185* 129*   Lipid Profile: No results for input(s): CHOL, HDL, LDLCALC, TRIG, CHOLHDL, LDLDIRECT in the last 72 hours. Thyroid Function Tests:  Recent Labs  12/16/15 0328  TSH 0.906   Anemia Panel: No results for input(s): VITAMINB12, FOLATE, FERRITIN, TIBC, IRON, RETICCTPCT in the last 72 hours. Urine  analysis:    Component Value Date/Time   COLORURINE AMBER (A) 12/15/2015 2216   APPEARANCEUR CLOUDY (A) 12/15/2015 2216   LABSPEC 1.013 12/15/2015 2216   PHURINE 5.0 12/15/2015 2216   GLUCOSEU NEGATIVE 12/15/2015 2216   HGBUR LARGE (A) 12/15/2015 2216   BILIRUBINUR SMALL (A) 12/15/2015 2216   KETONESUR NEGATIVE 12/15/2015 2216   PROTEINUR NEGATIVE 12/15/2015 2216   UROBILINOGEN 2.0 (H) 09/08/2012 2138   NITRITE NEGATIVE 12/15/2015 2216   LEUKOCYTESUR TRACE (A) 12/15/2015 2216   Sepsis Labs: @LABRCNTIP (procalcitonin:4,lacticidven:4)  ) Recent Results (from the past 240 hour(s))  Blood Culture (routine x 2)     Status: None (Preliminary result)   Collection Time: 12/15/15  8:32 PM  Result Value Ref Range Status   Specimen Description BLOOD LEFT ANTECUBITAL  Final   Special Requests BOTTLES DRAWN AEROBIC AND ANAEROBIC 5CC  Final   Culture NO GROWTH 2 DAYS  Final   Report Status PENDING  Incomplete  Blood Culture (routine x 2)     Status: None (Preliminary result)   Collection Time: 12/15/15  9:10 PM  Result Value Ref Range Status   Specimen Description BLOOD RIGHT HAND  Final   Special Requests IN PEDIATRIC BOTTLE 4CC  Final   Culture NO GROWTH 2 DAYS  Final   Report Status PENDING  Incomplete  Urine culture     Status: None   Collection Time: 12/15/15 10:16 PM  Result Value Ref Range Status   Specimen Description URINE, CATHETERIZED  Final   Special Requests NONE  Final   Culture NO GROWTH  Final   Report Status 12/17/2015 FINAL  Final  MRSA PCR Screening     Status: None   Collection Time: 12/16/15  6:30 AM  Result Value Ref Range Status   MRSA by PCR NEGATIVE NEGATIVE Final    Comment:        The GeneXpert MRSA Assay (FDA approved for NASAL specimens only), is one component of a comprehensive MRSA colonization surveillance  program. It is not intended to diagnose MRSA infection nor to guide or monitor treatment for MRSA infections.       Radiology  Studies: Dg Chest 2 View  Result Date: 12/17/2015 CLINICAL DATA:  Generalized weakness EXAM: CHEST  2 VIEW COMPARISON:  12/15/2015 FINDINGS: Cardiac shadow is stable. The lungs are well aerated bilaterally. No focal infiltrate or sizable effusion is seen. No acute bony abnormality is noted. IMPRESSION: No active cardiopulmonary disease. Electronically Signed   By: Alcide Clever M.D.   On: 12/17/2015 14:29     Scheduled Meds: . aspirin  150 mg Rectal Daily  . famotidine (PEPCID) IV  20 mg Intravenous Q24H  . insulin aspart  0-15 Units Subcutaneous Q4H  . levofloxacin (LEVAQUIN) IV  500 mg Intravenous Q24H  . metronidazole  500 mg Intravenous Q8H  . sodium chloride flush  3 mL Intravenous Q12H  . vancomycin  750 mg Intravenous Q12H   Continuous Infusions: . dextrose 5 % 1,000 mL with potassium chloride 80 mEq infusion 100 mL/hr at 12/18/15 0800  . heparin 750 Units/hr (12/18/15 0800)     LOS: 3 days   Time Spent in minutes   30 minutes  Belissa Kooy D.O. on 12/18/2015 at 9:24 AM  Between 7am to 7pm - Pager - [phone]  After 7pm go to [email or web address] - password [email or web address]  And look for the night coverage person covering for me after hours  [facility] Office  [phone]

## 2015-12-18 NOTE — NC FL2 (Signed)
[place] MEDICAID FL2 LEVEL OF CARE SCREENING TOOL     IDENTIFICATION  Patient Name: Janet Griffin Birthdate: [birth year] Sex: female Admission Date (Current Location): 12/15/2015  [facility] [place]   Facility and Address:  [facility] [place], [address], [place], [place] [address]      Provider Number: [id number]  Attending Physician Name and Address:  Edsel PetrinMaryann Mikhail, DO  Relative Name and Phone Number:       Current Level of Care: Hospital Recommended Level of Care: Skilled Nursing Facility Prior Approval Number:    Date Approved/Denied:   PASRR Number: [id number]  Discharge Plan: SNF    Current Diagnoses: Patient Active Problem List   Diagnosis Date Noted  . Pressure injury of skin 12/16/2015  . Encephalopathy   . Sepsis (HCC) 12/15/2015  . Acute kidney injury (HCC) 12/15/2015  . Hypernatremia 12/15/2015  . Acute metabolic encephalopathy 12/15/2015  . Lactic acidosis 12/15/2015  . Pure hypercholesterolemia 11/30/2015  . GERD without esophagitis 11/30/2015  . Essential hypertension 11/30/2015  . History of DVT of lower extremity 11/30/2015    Orientation RESPIRATION BLADDER Height & Weight        Normal Incontinent, Indwelling catheter Weight: 165 lb 5.5 oz (75 kg) Height:  5\' 7"  (170.2 cm)  BEHAVIORAL SYMPTOMS/MOOD NEUROLOGICAL BOWEL NUTRITION STATUS   (None)  (None) Incontinent Diet (See discharge summary once available.)  AMBULATORY STATUS COMMUNICATION OF NEEDS Skin     Non-Verbally  (Unstageable pressure injury left foot and left heel.)                       Personal Care Assistance Level of Assistance              Functional Limitations Info  Speech, Sight, Hearing Sight Info: Adequate Hearing Info: Adequate Speech Info: Impaired ([place])    SPECIAL CARE FACTORS FREQUENCY  Blood pressure                    Contractures Contractures Info: Not present    Additional Factors Info   Code Status, Allergies Code Status Info: Full Allergies Info: Penicillins           Current Medications (12/18/2015):  This is the current hospital active medication list Current Facility-Administered Medications  Medication Dose Route Frequency Provider Last Rate Last Dose  . acetaminophen (TYLENOL) tablet 650 mg  650 mg Oral Q6H PRN Michael LitterNikki Carter, MD       Or  . acetaminophen (TYLENOL) suppository 650 mg  650 mg Rectal Q6H PRN Michael LitterNikki Carter, MD      . aspirin suppository 150 mg  150 mg Rectal Daily Maryann Mikhail, DO   150 mg at 12/18/15 0924  . dextrose 5 % 1,000 mL with potassium chloride 80 mEq infusion   Intravenous Continuous Maryann Mikhail, DO 100 mL/hr at 12/18/15 1400    . famotidine (PEPCID) IVPB 20 mg premix  20 mg Intravenous Q24H Maryann Mikhail, DO   20 mg at 12/18/15 [id number]  . heparin ADULT infusion 100 units/mL (25000 units/2250mL sodium chloride 0.45%)  750 Units/hr Intravenous Continuous Emi HolesJennifer S Markle, RPH 7.5 mL/hr at 12/18/15 1400 750 Units/hr at 12/18/15 1400  . hydrALAZINE (APRESOLINE) injection 20 mg  20 mg Intravenous Q6H PRN Michael LitterNikki Carter, MD      . insulin aspart (novoLOG) injection 0-15 Units  0-15 Units Subcutaneous Q4H Michael LitterNikki Carter, MD   2 Units at 12/18/15 0848  . levofloxacin (LEVAQUIN)  IVPB 500 mg  500 mg Intravenous Q24H Cherre HugerKatherine R Cook, RPH   500 mg at 12/18/15 0846  . metroNIDAZOLE (FLAGYL) IVPB 500 mg  500 mg Intravenous Q8H Maryann Mikhail, DO   500 mg at 12/18/15 1130  . morphine 2 MG/ML injection 1 mg  1 mg Intravenous Q4H PRN Maryann Mikhail, DO   1 mg at 12/18/15 1330  . ondansetron (ZOFRAN) tablet 4 mg  4 mg Oral Q6H PRN Michael LitterNikki Carter, MD       Or  . ondansetron [facility](ZOFRAN) injection 4 mg  4 mg Intravenous Q6H PRN Michael LitterNikki Carter, MD      . sodium chloride flush (NS) 0.9 % injection 3 mL  3 mL Intravenous Q12H Michael LitterNikki Carter, MD   3 mL at 12/18/15 0901  . vancomycin (VANCOCIN) IVPB 750 mg/150 ml premix  750 mg Intravenous Q12H Maryann Mikhail, DO   750 mg  at 12/18/15 1330     Discharge Medications: Please see discharge summary for a list of discharge medications.  Relevant Imaging Results:  Relevant Lab Results:   Additional Information SS#: [id number]  Margarito LinerSarah C Jurline Folger, LCSW

## 2015-12-19 DIAGNOSIS — Z515 Encounter for palliative care: Secondary | ICD-10-CM

## 2015-12-19 DIAGNOSIS — T801XXA Vascular complications following infusion, transfusion and therapeutic injection, initial encounter: Secondary | ICD-10-CM

## 2015-12-19 DIAGNOSIS — Z7189 Other specified counseling: Secondary | ICD-10-CM

## 2015-12-19 LAB — GLUCOSE, CAPILLARY
GLUCOSE-CAPILLARY: 138 mg/dL — AB (ref 65–99)
GLUCOSE-CAPILLARY: 111 mg/dL — AB (ref 65–99)
Glucose-Capillary: 160 mg/dL — ABNORMAL HIGH (ref 65–99)
Glucose-Capillary: 116 mg/dL — ABNORMAL HIGH (ref 65–99)
Glucose-Capillary: 84 mg/dL (ref 65–99)
Glucose-Capillary: 135 mg/dL — ABNORMAL HIGH (ref 65–99)

## 2015-12-19 LAB — HEPARIN LEVEL (UNFRACTIONATED)
HEPARIN UNFRACTIONATED: 0.47 [IU]/mL (ref 0.30–0.70)
HEPARIN UNFRACTIONATED: 0.39 [IU]/mL (ref 0.30–0.70)

## 2015-12-19 LAB — BASIC METABOLIC PANEL
ANION GAP: 6 (ref 5–15)
BUN: 8 mg/dL (ref 6–20)
CALCIUM: 8 mg/dL — AB (ref 8.9–10.3)
CHLORIDE: 120 mmol/L — AB (ref 101–111)
CO2: 22 mmol/L (ref 22–32)
CREATININE: 0.78 mg/dL (ref 0.44–1.00)
GFR calc non Af Amer: >60 mL/min (ref >60)
Glucose, Bld: 154 mg/dL — ABNORMAL HIGH (ref 65–99)
Potassium: 4.6 mmol/L (ref 3.5–5.1)
SODIUM: 148 mmol/L — AB (ref 135–145)

## 2015-12-19 LAB — CBC
HEMATOCRIT: 36.1 % (ref 36.0–46.0)
HEMOGLOBIN: 11.4 g/dL — AB (ref 12.0–15.0)
MCH: 31.6 pg (ref 26.0–34.0)
MCHC: 31.6 g/dL (ref 30.0–36.0)
MCV: 100 fL (ref 78.0–100.0)
Platelets: 173 10*3/uL (ref 150–400)
RBC: 3.61 MIL/uL — ABNORMAL LOW (ref 3.87–5.11)
RDW: 14.2 % (ref 11.5–15.5)
WBC: 13.9 10*3/uL — AB (ref 4.0–10.5)

## 2015-12-19 MED ORDER — SODIUM CHLORIDE 0.9% FLUSH
10.0000 mL | Freq: Two times a day (BID) | INTRAVENOUS | Status: DC
  Administered 2015-12-19: 20 mL
  Administered 2015-12-20 – 2015-12-25 (×7): 10 mL

## 2015-12-19 MED ORDER — CHLORHEXIDINE GLUCONATE 0.12 % MT SOLN
15.0000 mL | Freq: Two times a day (BID) | OROMUCOSAL | Status: DC
  Administered 2015-12-19 – 2015-12-25 (×13): 15 mL via OROMUCOSAL
  Filled 2015-12-19 (×9): qty 15

## 2015-12-19 MED ORDER — SODIUM CHLORIDE 0.9% FLUSH
10.0000 mL | INTRAVENOUS | Status: DC | PRN
  Administered 2015-12-21 – 2015-12-22 (×3): 10 mL
  Filled 2015-12-19 (×3): qty 40

## 2015-12-19 MED ORDER — DEXTROSE 5 % IV SOLN
INTRAVENOUS | Status: DC
  Administered 2015-12-19 – 2015-12-20 (×4): via INTRAVENOUS

## 2015-12-19 MED ORDER — ORAL CARE MOUTH RINSE
15.0000 mL | Freq: Two times a day (BID) | OROMUCOSAL | Status: DC
  Administered 2015-12-19 – 2015-12-25 (×14): 15 mL via OROMUCOSAL

## 2015-12-19 NOTE — Consult Note (Addendum)
WOC Nurse wound consult note Reason for Consult: Consult requested for right leg.  Daughter states pt had an IV infiltrate to this location which occurred last night, when she was receiving Heparin. Wound type: Previous dark red-purple areas below the surface have evloved into a large yellow, fluid-filled blister which recently ruptured and become a thickness wound. Measurement: 19X10X.1cm in a square pattern Wound bed: Removed loose peeling skin from outer wound using forceps, revealing pink moist wound bed   Drainage (amount, consistency, odor) Large amt yellow drainage, no odor Periwound: Generalized edema and erythremia to RLE Dressing procedure/placement/frequency: Xeroform gauze to promote drying and healing of the wound bed, ABD pad and kerlex to absorb drainage, pt already has a Prevalon boot to reduce pressure. Discussed plan of care with daughter at the bedside and she assessed the wound appearance, pt tolerated dressing change and removal of peeling skin with minimal amt discomfort. Please re-consult if further assistance is needed.  Thank-you,  Cammie Mcgeeawn Shaneika Rossa MSN, RN, CWOCN, [place]AP, CNS [phone]

## 2015-12-19 NOTE — Progress Notes (Signed)
ANTICOAGULATION CONSULT NOTE - Follow Up Consult  Pharmacy Consult for Heparin Indication: h/o DVT  Allergies  Allergen Reactions  . Penicillins Hives and Anaphylaxis    Patient Measurements: Height: 5\' 7"  (170.2 cm) Weight: 180 lb 1.9 oz (81.7 kg) (with boots, 2 pillows, and blankets) IBW/kg (Calculated) : 61.6 Heparin Dosing Weight:  75 kg  Vital Signs: Temp: 99 F (37.2 C) (11/07 1100) Temp Source: Axillary (11/07 1100) BP: 106/61 (11/07 1100) Pulse Rate: 84 (11/07 1100)  Labs:  Recent Labs  12/16/15 2342  12/17/15 0515 12/17/15 0852 12/17/15 2018 12/18/15 0409 12/19/15 0705  HGB  --   < > 13.3  --   --  11.2* 11.4*  HCT  --   --  42.4  --   --  36.4 36.1  PLT  --   --  205  --   --  181 173  APTT 129*  --   --  152* 105* 59*  --   HEPARINUNFRC 2.06*  --   --   --   --  0.56 0.47  CREATININE  --   --  1.27*  --   --  0.90 0.78  < > = values in this interval not displayed.  Estimated Creatinine Clearance: 79.1 mL/min (by C-G formula based on SCr of 0.78 mg/dL).    Assessment:  Anticoag: On Eliquis PTA for h/o DVT (LD 11/3), per cards currently risk of bleeding outweighs continuing anticoag. Dr. Darlin CocoMikhail>>start heparin gtt for hx DVT. Has been >24 hrs since last dose of Eliquis. She is aware of cardiology concern for bleeding (per their note) and would like to proceed with heparin gtt. --HL 0.47, Hgb 11.4 stable, Plts 173.   Goal of Therapy:  Heparin level 0.3-0.7 units/ml Monitor platelets by anticoagulation protocol: Yes   Plan:  IV heparin 750 units/hr Daily HL and CBC (d/c aPTTs)  Jorja Empie S. Merilynn Finlandobertson, PharmD, BCPS Clinical Staff Pharmacist Pager [phone]  Misty Stanleyobertson, Amybeth Sieg Stillinger 12/19/2015,2:19 PM

## 2015-12-19 NOTE — Progress Notes (Signed)
PROGRESS NOTE    Janet Griffin  FYB:[id number] DOB: 10/23/1950 DOA: 12/15/2015 PCP: Keane PoliceSLADE-HARTMAN, [name], MD   Chief Complaint  Patient presents with  . Altered Mental Status    Brief Narrative:  HPI on 12/15/2015 by Dr. Michael LitterNikki Carter Janet Griffin is a 65 y.o. woman with a history of CVA with right sided weakness and dementia (she is nonverbal at baseline) who is a long term resident of a SNF.  She has been transferred to the ED for evaluation of mental status changes including increased lethargy (sleeping more), decreased PO intake, decreased eye contact with family.  She cannot verbalize specific pain complaints.  Her sister reports a history of a tooth abscess, for which the patient has completed a course of oral antibiotics but the tooth has not been pulled. Fever was documented prior to presentation.  She has had cough and increased wheezing per her sister.  The facility has reported decreased urine output, and her sister feels that the urine has an increased odor. Of note, she is also XARELTO for a history of LLE DVT.  Assessment & Plan   Severe sepsis secondary to ?UTI versus pneumonia -Upon admission, Noted to be febrile with leukocytosis, elevated troponin, AK I, lactic acid 9.37 -Kidney injury currently improving, lactic acid improving as well. -Continue IV fluids, broad-spectrum antibiotics with aztreonam and Levaquin, patient does have a penicillin allergy -Blood and urine cultures show no growth to date -Chest x-ray 11/3 mild medial left base opacity- atelectasis vs infiltrate -Strep pneumonia urine antigen negative -Repeat CXR 11/5: unremarkable for infection -Patient had a "temperature of 100.23F" today, however was under 3 blankets- temperature unreliable.  -Currently afebrile, leukocytosis improving  Acute metabolic encephalopathy -possible related to infection -poor baseline- spoke with patient's sister at bedside,suspect this is patient is at her baseline at  this time.  Acute hypoxic respiratory failure -SpO2 89%, pO2 on ABG 75 -weaned off of nasal canula, currently O2 sats in 100s on room air  Elevated troponin -Likely secondary to sepsis/demand ischemia -Peaked at 1.39, currently trending downward 0.94 -Echocardiogram: EF 45-50%, grade 1 diastolic dysfunction -Cardiology consulted and appreciated- recommended medical management if echocardiogram ok, would not pursue ischemic testing  Acute kidney injury -Secondary to sepsis and dehydration -Creatinine <1 at baseline, was up to 4.67 upon admission -Currently improving,0.78 -Continue to monitor BMP  Hypernatremia and hyperchloremia -Likely secondary to the above, improving  -Continue IVF -Monitor BMP  Essential Hypertension -Continue hydralazine PRN  Possible history CVA -CT head unremarkable -Reviewed prior CTs, no CVAs noted  -Per family, patient became nonverbal last year, started having left sided weakness starting about 8 months ago  History of LLE DVT -patient was on Xarelto -Continue IV heparin  Dysphagia -Per sister, due to abscessed tooth -Will continue to monitor -Spoke with sister at bedside regarding possible need for peg tube If patient does not improve -Speech therapy consulted  Hypokalemia -Continue replacement and monitor BMP  Code status -Spoke with sister who is POA, patient is a full code.   -Spoke with sister and son via phone.  Agreeable to palliative care consult.  -Palliative care consulted and appreciated.   DVT Prophylaxis  heparin  Code Status: Full  Family Communication: None at bedside  Disposition Plan: Admitted. Continue to monitor. When stable, d/c to SNF.  Consultants Cardiology Palliative care  Procedures  Echocardiogram  Antibiotics   Anti-infectives    Start     Dose/Rate Route Frequency Ordered Stop   12/18/15 0000  levofloxacin (LEVAQUIN) IVPB  500 mg  Status:  Discontinued     500 mg 100 mL/hr over 60 Minutes  Intravenous Every 48 hours 12/16/15 0012 12/17/15 0845   12/17/15 1230  vancomycin (VANCOCIN) IVPB 750 mg/150 ml premix     750 mg 150 mL/hr over 60 Minutes Intravenous Every 12 hours 12/17/15 1213     12/17/15 1215  metroNIDAZOLE (FLAGYL) IVPB 500 mg     500 mg 100 mL/hr over 60 Minutes Intravenous Every 8 hours 12/17/15 1213     12/17/15 0900  levofloxacin (LEVAQUIN) IVPB 500 mg     500 mg 100 mL/hr over 60 Minutes Intravenous Every 24 hours 12/17/15 0845     12/17/15 0800  aztreonam (AZACTAM) 1 g in dextrose 5 % 50 mL IVPB  Status:  Discontinued     1 g 100 mL/hr over 30 Minutes Intravenous Every 8 hours 12/17/15 0717 12/17/15 1213   12/16/15 0800  aztreonam (AZACTAM) 500 mg in dextrose 5 % 50 mL IVPB  Status:  Discontinued     500 mg 100 mL/hr over 30 Minutes Intravenous Every 8 hours 12/16/15 0012 12/17/15 0717   12/16/15 0015  aztreonam (AZACTAM) 500 mg in dextrose 5 % 50 mL IVPB     500 mg 100 mL/hr over 30 Minutes Intravenous  Once 12/16/15 0012 12/16/15 0151   12/16/15 0015  levofloxacin (LEVAQUIN) IVPB 500 mg     500 mg 100 mL/hr over 60 Minutes Intravenous  Once 12/16/15 0012 12/16/15 0419      Subjective:   Janet Rout seen and examined today.  Patient is nonverbal  Objective:   Vitals:   12/19/15 0500 12/19/15 0700 12/19/15 0800 12/19/15 1100  BP:  109/75 118/86 106/61  Pulse:  88 85 84  Resp:  (!) 35 (!) 24 (!) 28  Temp:  98.8 F (37.1 C)  99 F (37.2 C)  TempSrc:  Axillary  Axillary  SpO2:  99% 100% 100%  Weight: 81.7 kg (180 lb 1.9 oz)     Height:        Intake/Output Summary (Last 24 hours) at 12/19/15 1136 Last data filed at 12/19/15 1100  Gross per 24 hour  Intake          5166.55 ml  Output              925 ml  Net          4241.55 ml   Filed Weights   12/17/15 0500 12/18/15 0500 12/19/15 0500  Weight: 73.3 kg (161 lb 9.6 oz) 75 kg (165 lb 5.5 oz) 81.7 kg (180 lb 1.9 oz)    Exam (No change)  General: Well developed, well nourished, no  distress  HEENT: NCAT,  mucous membranes moist  Cardiovascular: S1 S2 auscultated, no murmurs, RRR  Respiratory: Clear to auscultation bilaterally with equal chest rise  Abdomen: Soft, nontender, nondistended, + bowel sounds, well-healed abd scar  Extremities: warm dry without cyanosis clubbing or edema  Neuro: Cannot assess  Psych: Cannot assess   Data Reviewed: I have personally reviewed following labs and imaging studies  CBC:  Recent Labs Lab 12/15/15 2032 12/17/15 0515 12/18/15 0409 12/19/15 0705  WBC 16.2* 18.7* 18.1* 13.9*  NEUTROABS 14.2*  --   --   --   HGB 17.8* 13.3 11.2* 11.4*  HCT 55.5* 42.4 36.4 36.1  MCV 101.1* 100.7* 100.3* 100.0  PLT 284 205 181 173   Basic Metabolic Panel:  Recent Labs Lab 12/16/15 0728 12/16/15 1255 12/17/15 0515 12/17/15  [id number] 12/18/15 0409 12/19/15 0705  NA 161* 162* 161*  --  157* 148*  K 3.8 2.9* 2.8*  --  3.3* 4.6  CL 126* >130* 129*  --  125* 120*  CO2 22 22 23   --  24 22  GLUCOSE 297* 211* 138*  --  175* 154*  BUN 80* 71* 44*  --  18 8  CREATININE 2.72* 2.19* 1.27*  --  0.90 0.78  CALCIUM 7.9* 8.5* 8.4*  --  8.1* 8.0*  MG  --   --   --  2.2  --   --    GFR: Estimated Creatinine Clearance: 79.1 mL/min (by C-G formula based on SCr of 0.78 mg/dL). Liver Function Tests:  Recent Labs Lab 12/15/15 2032 12/16/15 0030  AST 112* 94*  ALT 156* 127*  ALKPHOS 77 62  BILITOT 3.8* 2.9*  PROT 7.8 6.9  ALBUMIN 3.9 3.5   No results for input(s): LIPASE, AMYLASE in the last 168 hours. No results for input(s): AMMONIA in the last 168 hours. Coagulation Profile:  Recent Labs Lab 12/16/15 0328  INR 1.40   Cardiac Enzymes:  Recent Labs Lab 12/16/15 0030 12/16/15 0328 12/16/15 0516 12/16/15 0728  TROPONINI 1.39* 1.28* 0.97* 0.94*   BNP (last 3 results) No results for input(s): PROBNP in the last 8760 hours. HbA1C: No results for input(s): HGBA1C in the last 72 hours. CBG:  Recent Labs Lab 12/18/15 1621  12/18/15 1906 12/18/15 2345 12/19/15 0325 12/19/15 0736  GLUCAP 117* 107* 119* 160* 116*   Lipid Profile: No results for input(s): CHOL, HDL, LDLCALC, TRIG, CHOLHDL, LDLDIRECT in the last 72 hours. Thyroid Function Tests: No results for input(s): TSH, T4TOTAL, FREET4, T3FREE, THYROIDAB in the last 72 hours. Anemia Panel: No results for input(s): VITAMINB12, FOLATE, FERRITIN, TIBC, IRON, RETICCTPCT in the last 72 hours. Urine analysis:    Component Value Date/Time   COLORURINE AMBER (A) 12/15/2015 2216   APPEARANCEUR CLOUDY (A) 12/15/2015 2216   LABSPEC 1.013 12/15/2015 2216   PHURINE 5.0 12/15/2015 2216   GLUCOSEU NEGATIVE 12/15/2015 2216   HGBUR LARGE (A) 12/15/2015 2216   BILIRUBINUR SMALL (A) 12/15/2015 2216   KETONESUR NEGATIVE 12/15/2015 2216   PROTEINUR NEGATIVE 12/15/2015 2216   UROBILINOGEN 2.0 (H) 09/08/2012 2138   NITRITE NEGATIVE 12/15/2015 2216   LEUKOCYTESUR TRACE (A) 12/15/2015 2216   Sepsis Labs: @LABRCNTIP (procalcitonin:4,lacticidven:4)  ) Recent Results (from the past 240 hour(s))  Blood Culture (routine x 2)     Status: None (Preliminary result)   Collection Time: 12/15/15  8:32 PM  Result Value Ref Range Status   Specimen Description BLOOD LEFT ANTECUBITAL  Final   Special Requests BOTTLES DRAWN AEROBIC AND ANAEROBIC 5CC  Final   Culture NO GROWTH 3 DAYS  Final   Report Status PENDING  Incomplete  Blood Culture (routine x 2)     Status: None (Preliminary result)   Collection Time: 12/15/15  9:10 PM  Result Value Ref Range Status   Specimen Description BLOOD RIGHT HAND  Final   Special Requests IN PEDIATRIC BOTTLE 4CC  Final   Culture NO GROWTH 3 DAYS  Final   Report Status PENDING  Incomplete  Urine culture     Status: None   Collection Time: 12/15/15 10:16 PM  Result Value Ref Range Status   Specimen Description URINE, CATHETERIZED  Final   Special Requests NONE  Final   Culture NO GROWTH  Final   Report Status 12/17/2015 FINAL  Final  MRSA  PCR Screening  Status: None   Collection Time: 12/16/15  6:30 AM  Result Value Ref Range Status   MRSA by PCR NEGATIVE NEGATIVE Final    Comment:        The GeneXpert MRSA Assay (FDA approved for NASAL specimens only), is one component of a comprehensive MRSA colonization surveillance program. It is not intended to diagnose MRSA infection nor to guide or monitor treatment for MRSA infections.       Radiology Studies: Dg Chest 2 View  Result Date: 12/17/2015 CLINICAL DATA:  Generalized weakness EXAM: CHEST  2 VIEW COMPARISON:  12/15/2015 FINDINGS: Cardiac shadow is stable. The lungs are well aerated bilaterally. No focal infiltrate or sizable effusion is seen. No acute bony abnormality is noted. IMPRESSION: No active cardiopulmonary disease. Electronically Signed   By: Alcide Clever M.D.   On: 12/17/2015 14:29     Scheduled Meds: . aspirin  150 mg Rectal Daily  . chlorhexidine  15 mL Mouth Rinse BID  . famotidine (PEPCID) IV  20 mg Intravenous Q24H  . insulin aspart  0-15 Units Subcutaneous Q4H  . levofloxacin (LEVAQUIN) IV  500 mg Intravenous Q24H  . mouth rinse  15 mL Mouth Rinse q12n4p  . metronidazole  500 mg Intravenous Q8H  . sodium chloride flush  3 mL Intravenous Q12H  . vancomycin  750 mg Intravenous Q12H   Continuous Infusions: . dextrose 5 % 1,000 mL with potassium chloride 80 mEq infusion Stopped (12/19/15 0840)  . heparin Stopped (12/19/15 0840)     LOS: 4 days   Time Spent in minutes   30 minutes  Solenne Manwarren D.O. on 12/19/2015 at 11:36 AM  Between 7am to 7pm - Pager - [phone]  After 7pm go to [email or web address] - password [email or web address]  And look for the night coverage person covering for me after hours  [facility] Office  [phone]

## 2015-12-19 NOTE — Progress Notes (Signed)
ANTICOAGULATION CONSULT NOTE - Follow Up Consult  Pharmacy Consult for Heparin Indication: h/o DVT  Allergies  Allergen Reactions  . Penicillins Hives and Anaphylaxis    Patient Measurements: Height: 5\' 7"  (170.2 cm) Weight: 180 lb 1.9 oz (81.7 kg) (with boots, 2 pillows, and blankets) IBW/kg (Calculated) : 61.6 Heparin Dosing Weight:  75 kg  Vital Signs: Temp: 98.6 F (37 C) (11/07 1948) Temp Source: Axillary (11/07 1948) BP: 106/63 (11/07 1948) Pulse Rate: 80 (11/07 1948)  Labs:  Recent Labs  12/17/15 0515 12/17/15 0852 12/17/15 2018 12/18/15 0409 12/19/15 0705 12/19/15 1927  HGB 13.3  --   --  11.2* 11.4*  --   HCT 42.4  --   --  36.4 36.1  --   PLT 205  --   --  181 173  --   APTT  --  152* 105* 59*  --   --   HEPARINUNFRC  --   --   --  0.56 0.47 0.39  CREATININE 1.27*  --   --  0.90 0.78  --     Estimated Creatinine Clearance: 79.1 mL/min (by C-G formula based on SCr of 0.78 mg/dL).    Assessment:  Anticoag: On Eliquis PTA for h/o DVT (LD 11/3), per cards currently risk of bleeding outweighs continuing anticoag. Dr. Catha GosselinMikhail is aware of cardiology concern and ordered heparin gtt for hx DVT. RN called to report heparin infusion being held between 0840 and 1230 2/2 to IV infiltration. No infusion issues at this time. Ordered confirmatory lab this afternoon to ensure therapeutic level.    --HL 0.39, Hgb 11.4 stable, Plts 173.   Goal of Therapy:  Heparin level 0.3-0.7 units/ml Monitor platelets by anticoagulation protocol: Yes   Plan:  IV heparin 750 units/hr Daily HL and CBC (d/c aPTTs)   Alinda Moneyony L Sema Stangler 12/19/2015,8:20 PM

## 2015-12-19 NOTE — Consult Note (Signed)
Consultation Note Date: 12/19/2015   Patient Name: Janet RoutLennetta Griffin  DOB: Nov 11, 1950  MRN: [id number]  Age / Sex: 65 y.o., female  PCP: Keane PoliceVenezela Slade-Hartman, MD Referring Physician: Edsel PetrinMaryann Mikhail, DO  Reason for Consultation: Establishing goals of care  HPI/Patient Profile: 65 y.o. 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 oral abscess vs. UTI.   Clinical Assessment and Goals of Care: Patient is nonverbal at baseline. Sister reports she has stopped eating and drinking at nursing facility. Darreld Mcleanrina attributes this to an oral abscess and believes that her sister's infectious source is the abscess that was not adequately treated at the SNF. Sister is concerned about an IV infiltration on patient's lower leg. Discussed wound consult. Patient in bed, nonverbal, tracks, not following commands. She is grinding her teeth and her sister states this is something that she does and does not indicate pain, however, patient does intermittently become agitated and makes unintelligible verbalizations and Darreld Mcleanrina notes this appears to be her indicating pain or discomfort. 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 Darreld Mcleanrina has been caretaker and has HCPOA and [place] her sister. Patient is married, but spouse is disabled and was not able to assist in patient's care. He is not involved in decision making. Patient is nonambulatory, nonverbal and not eating a drinking. Discussed GOC with Darreld Mcleanrina in person and patient's son, daughter, and siblings via conference call. All were clear that they do not want patient to suffer. Discussed  disease trajectory of dementia, code status and possible outcomes, PEG. Family and HCPOA determined that a full code status and PEG placement would not improve patient's quality of life. They request for now to treat what is treatable and attempt to maintain patient's life until her son can travel from [place]. He is making arrangements now. They would then like to discuss transitioning patient's care to comfort.   HCPOA - sister- Tish Menrina Brown    SUMMARY OF RECOMMENDATIONS  -Family meeting held -NO FEEDING TUBE -DNR -Continue current level of care until son, Willadean CarolStevie, Jr. Arrives from [place], then family wants to consider comfort care -Avoid invasive diagnostic procedures -Wound consult ordered for R lower extremity IV infiltration -Pain- continue current morphine, ice to cheek area for oral abscess    Code Status/Advance Care Planning:  DNR    Symptom Management:   - see above  Palliative Prophylaxis:   Delirium Protocol  Additional Recommendations (Limitations, Scope, Preferences):  No Artificial Feeding  Psycho-social/Spiritual:   Desire for further Chaplaincy support:No  Additional Recommendations: Education on Hospice  Prognosis:   < 2 weeks d/t po intake, advanced dementia, decreased functional status  Discharge Planning: To Be Determined      Primary Diagnoses: Present on Admission: . Sepsis (HCC) . Essential hypertension   I have  reviewed the medical record, interviewed the patient and family, and examined the patient. The following aspects are pertinent.  Past Medical History:  Diagnosis Date  . Dementia    Social History   Social History  . Marital status: Married    Spouse name: N/A  . Number of children: N/A  . Years of education: N/A   Social History Main Topics  . Smoking status: Never Smoker  . Smokeless tobacco: Never Used  . Alcohol use No  . Drug use: Unknown  . Sexual activity: Not Asked   Other Topics Concern  . None    Social History Narrative  . None   History reviewed. No pertinent family history. Scheduled Meds: . aspirin  150 mg Rectal Daily  . famotidine (PEPCID) IV  20 mg Intravenous Q24H  . insulin aspart  0-15 Units Subcutaneous Q4H  . levofloxacin (LEVAQUIN) IV  500 mg Intravenous Q24H  . metronidazole  500 mg Intravenous Q8H  . sodium chloride flush  3 mL Intravenous Q12H  . vancomycin  750 mg Intravenous Q12H   Continuous Infusions: . dextrose 5 % 1,000 mL with potassium chloride 80 mEq infusion Stopped (12/19/15 0840)  . heparin Stopped (12/19/15 0840)   PRN Meds:.acetaminophen **OR** acetaminophen, hydrALAZINE, morphine injection, ondansetron **OR** ondansetron (ZOFRAN) IV Medications Prior to Admission:  Prior to Admission medications   Medication Sig Start Date End Date Taking? Authorizing Provider  acetaminophen (TYLENOL) 650 MG suppository Place 650 mg rectally every 4 (four) hours as needed for mild pain.   Yes Historical Provider, MD  albuterol (PROVENTIL) (2.5 MG/3ML) 0.083% nebulizer solution Take 2.5 mg by nebulization every 6 (six) hours as needed for wheezing or shortness of breath.   Yes Historical Provider, MD  amLODipine (NORVASC) 5 MG tablet Take 5 mg by mouth daily.   Yes Historical Provider, MD  apixaban (ELIQUIS) 2.5 MG TABS tablet Take 2.5 mg by mouth 2 (two) times daily.   Yes Historical Provider, MD  bisacodyl (DULCOLAX) 10 MG suppository Place 10 mg rectally daily as needed for moderate constipation.   Yes Historical Provider, MD  capsaicin (ZOSTRIX) 0.025 % cream Apply 1 application topically 3 (three) times daily. Apply thin layer to bilateral knees   Yes Historical Provider, MD  hydrochlorothiazide (MICROZIDE) 12.5 MG capsule Take 12.5 mg by mouth daily.   Yes Historical Provider, MD  HYDROcodone-acetaminophen (NORCO/VICODIN) 5-325 MG tablet Take one tablet by mouth every six hours for pain; Take 1/2 tablet by mouth twice daily as needed for breakthrough pain.  Do not exceed 3gm of Tylenol in 24 hours 12/06/15  Yes Kirt BoysMonica Carter, DO  Hypromellose (ARTIFICIAL TEARS OP) Apply 1 drop to eye 4 (four) times daily as needed.   Yes Historical Provider, MD  Melatonin 3 MG TABS Take 1 tablet by mouth at bedtime.   Yes Historical Provider, MD  memantine (NAMENDA) 10 MG tablet Take 30 mg by mouth 2 (two) times daily.   Yes Historical Provider, MD  polyethylene glycol (MIRALAX / GLYCOLAX) packet Take 17 g by mouth at bedtime.   Yes Historical Provider, MD  ranitidine (ZANTAC) 75 MG tablet Take 75 mg by mouth at bedtime.   Yes Historical Provider, MD  senna (SENOKOT) 8.6 MG tablet Take 1 tablet by mouth as needed for constipation.   Yes Historical Provider, MD  simvastatin (ZOCOR) 10 MG tablet Take 10 mg by mouth daily.   Yes Historical Provider, MD  traZODone (DESYREL) 100 MG tablet Take 100 mg by mouth at bedtime.  Yes Historical Provider, MD   Allergies  Allergen Reactions  . Penicillins Hives and Anaphylaxis   Review of Systems  Unable to perform ROS: Patient nonverbal    Physical Exam  Constitutional: She appears well-developed and well-nourished.  HENT:  Head: Normocephalic and atraumatic.  Eyes: EOM are normal.  Cardiovascular: Normal rate, regular rhythm and normal heart sounds.   Pulmonary/Chest: No respiratory distress. She has wheezes. She has rales.  Wet cough  Genitourinary:  Genitourinary Comments: Foley in place  Musculoskeletal: She exhibits edema (in R hand, in R lower leg and foot).  R hand/arm contracted  Neurological: She exhibits abnormal muscle tone.  nonverbal  Skin:  R lower extremity with deep purple area, weeping, blister in the center of it covered with foam dressing  Psychiatric:  nonverbal    Vital Signs: BP 118/86 (BP Location: Right Arm)   Pulse 85   Temp 98.8 F (37.1 C) (Axillary)   Resp (!) 24   Ht 5\' 7"  (1.702 m)   Wt 81.7 kg (180 lb 1.9 oz) Comment: with boots, 2 pillows, and blankets  SpO2 100%   BMI  28.21 kg/m  Pain Assessment: CPOT POSS *See Group Information*: S-Acceptable,Sleep, easy to arouse Pain Score: 0-No pain   SpO2: SpO2: 100 % O2 Device:SpO2: 100 % O2 Flow Rate: .O2 Flow Rate (L/min): 1 L/min  IO: Intake/output summary:  Intake/Output Summary (Last 24 hours) at 12/19/15 1054 Last data filed at 12/19/15 0840  Gross per 24 hour  Intake          5163.55 ml  Output             1050 ml  Net          4113.55 ml    LBM: Last BM Date: 12/19/15 Baseline Weight: Weight: 81.6 kg (180 lb) Most recent weight: Weight: 81.7 kg (180 lb 1.9 oz) (with boots, 2 pillows, and blankets)     Palliative Assessment/Data: PPS: 10%    Thank you for this consult. Palliative medicine will continue to follow.   Time In: 0930 Time Out: 1045 Time In (conference call): 1500 Time Total: (conference call): 1610 Prolonged billing: Yes  Greater than 50%  of this time was spent counseling and coordinating care related to the above assessment and plan.  Signed by: Ocie Bob, AGNP-C   Please contact Palliative Medicine Team phone at [phone] for questions and concerns.  For individual provider: See Loretha Stapler

## 2015-12-19 NOTE — Evaluation (Signed)
Clinical/Bedside Swallow Evaluation Patient Details  Name: Janet Griffin MRN: [id number] Date of Birth: Apr 16, 1950  Today's Date: 12/19/2015 Time: SLP Start Time (ACUTE ONLY): 1045 SLP Stop Time (ACUTE ONLY): 1109 SLP Time Calculation (min) (ACUTE ONLY): 24 min  Past Medical History:  Past Medical History:  Diagnosis Date  . Dementia    Past Surgical History:  Past Surgical History:  Procedure Laterality Date  . CHOLECYSTECTOMY     HPI:  Patient is a 65 y/o female with hx of CVA with residual right sided weakness, DVT LLE and dementia who is a long term resident at SNF presents with AMS, increased lethargy and decreased PO intake. Found to have Acute metabolic encephalopathy secondary to AKI in the setting of apparent extreme dehydration and possible UTI. Sepsis protocol initiated. CXR-mild medial left base opacity- atelectasis vs infiltrate. Pt's sister says she was on Dys 1 diet and honey thick liquids at SNF.   Assessment / Plan / Recommendation Clinical Impression  Pt is nonverbal, but presented with visible pain throughout the evaluation. Sister present believes her pain is caused by oral and dental issues. Sister also stated she recently provided oral care and that the pt's baseline diet was Dys 1 and honey thick liquids at SNF. Despite maximal encouragement and cueing, pt refused all PO trials of her baseline diet by not opening her mouth. Sister stated her mentation is close to baseline. Recommend pt continue as NPO given her pain level and refusal of trials. Will f/u for PO readiness.    Aspiration Risk  Moderate aspiration risk    Diet Recommendation NPO   Medication Administration: Via alternative means    Other  Recommendations Oral Care Recommendations: Oral care QID   Follow up Recommendations Other (comment) (tba)      Frequency and Duration min 1 x/week  2 weeks       Prognosis Prognosis for Safe Diet Advancement: Good Barriers to Reach Goals: Cognitive  deficits;Severity of deficits      Swallow Study   General HPI: Patient is a 65 y/o female with hx of CVA with residual right sided weakness, DVT LLE and dementia who is a long term resident at SNF presents with AMS, increased lethargy and decreased PO intake. Found to have Acute metabolic encephalopathy secondary to AKI in the setting of apparent extreme dehydration and possible UTI. Sepsis protocol initiated. CXR-mild medial left base opacity- atelectasis vs infiltrate. Pt's sister says she was on Dys 1 diet and honey thick liquids at SNF. Type of Study: Bedside Swallow Evaluation Previous Swallow Assessment: none in chart Diet Prior to this Study: NPO Temperature Spikes Noted: No Respiratory Status: Room air History of Recent Intubation: No Behavior/Cognition: Alert;Other (Comment);Requires cueing (in pain) Oral Cavity Assessment: Other (comment) (difficult to assess) Oral Care Completed by SLP: Other (Comment) (sister reports recent completion) Oral Cavity - Dentition: Poor condition Self-Feeding Abilities: Refused PO Patient Positioning: Upright in bed Baseline Vocal Quality: Normal Volitional Cough: Strong    Oral/Motor/Sensory Function Overall Oral Motor/Sensory Function: Other (comment) (unable to assess)   Ice Chips Ice chips: Not tested   Thin Liquid Thin Liquid: Not tested    Nectar Thick Nectar Thick Liquid: Not tested   Honey Thick Honey Thick Liquid: Impaired Presentation: Spoon Oral Phase Impairments: Poor awareness of bolus;Other (comment) (no acceptance)   Puree Puree: Impaired Presentation: Spoon Oral Phase Impairments: Poor awareness of bolus;Other (comment) (no acceptance)   Solid   GO   Solid: Not tested  Tollie EthHaleigh Ragan [name], Student SLP  Caryl NeverHaleigh R Orlandria Kissner 12/19/2015,11:51 AM

## 2015-12-20 DIAGNOSIS — T801XXA Vascular complications following infusion, transfusion and therapeutic injection, initial encounter: Secondary | ICD-10-CM

## 2015-12-20 DIAGNOSIS — F0280 Dementia in other diseases classified elsewhere without behavioral disturbance: Secondary | ICD-10-CM

## 2015-12-20 DIAGNOSIS — G3109 Other frontotemporal dementia: Secondary | ICD-10-CM

## 2015-12-20 DIAGNOSIS — Z7189 Other specified counseling: Secondary | ICD-10-CM

## 2015-12-20 DIAGNOSIS — Z515 Encounter for palliative care: Secondary | ICD-10-CM

## 2015-12-20 LAB — BASIC METABOLIC PANEL
ANION GAP: 6 (ref 5–15)
BUN: 5 mg/dL — ABNORMAL LOW (ref 6–20)
CALCIUM: 7.5 mg/dL — AB (ref 8.9–10.3)
CO2: 22 mmol/L (ref 22–32)
Chloride: 113 mmol/L — ABNORMAL HIGH (ref 101–111)
Creatinine, Ser: 0.62 mg/dL (ref 0.44–1.00)
GLUCOSE: 157 mg/dL — AB (ref 65–99)
POTASSIUM: 3.3 mmol/L — AB (ref 3.5–5.1)
SODIUM: 141 mmol/L (ref 135–145)

## 2015-12-20 LAB — GLUCOSE, CAPILLARY
GLUCOSE-CAPILLARY: 151 mg/dL — AB (ref 65–99)
GLUCOSE-CAPILLARY: 98 mg/dL (ref 65–99)
Glucose-Capillary: 126 mg/dL — ABNORMAL HIGH (ref 65–99)
Glucose-Capillary: 111 mg/dL — ABNORMAL HIGH (ref 65–99)
Glucose-Capillary: 123 mg/dL — ABNORMAL HIGH (ref 65–99)

## 2015-12-20 LAB — CBC
HCT: 30.2 % — ABNORMAL LOW (ref 36.0–46.0)
Hemoglobin: 9.7 g/dL — ABNORMAL LOW (ref 12.0–15.0)
MCH: 31.4 pg (ref 26.0–34.0)
MCHC: 32.1 g/dL (ref 30.0–36.0)
MCV: 97.7 fL (ref 78.0–100.0)
PLATELETS: 178 10*3/uL (ref 150–400)
RBC: 3.09 MIL/uL — AB (ref 3.87–5.11)
RDW: 13.8 % (ref 11.5–15.5)
WBC: 11.7 10*3/uL — AB (ref 4.0–10.5)

## 2015-12-20 LAB — CULTURE, BLOOD (ROUTINE X 2)
CULTURE: NO GROWTH
Culture: NO GROWTH

## 2015-12-20 LAB — HEPARIN LEVEL (UNFRACTIONATED): HEPARIN UNFRACTIONATED: 0.4 [IU]/mL (ref 0.30–0.70)

## 2015-12-20 MED ORDER — MORPHINE SULFATE (PF) 4 MG/ML IV SOLN
1.0000 mg | INTRAVENOUS | Status: DC | PRN
  Administered 2015-12-20 – 2015-12-25 (×22): 1 mg via INTRAVENOUS
  Filled 2015-12-20 (×22): qty 1

## 2015-12-20 MED ORDER — HEPARIN SODIUM (PORCINE) 5000 UNIT/ML IJ SOLN: 5000.0000 [IU] | Freq: Three times a day (TID) | INTRAMUSCULAR | Status: DC

## 2015-12-20 MED ORDER — POTASSIUM CHLORIDE 2 MEQ/ML IV SOLN
INTRAVENOUS | Status: DC
  Administered 2015-12-20 – 2015-12-21 (×2): via INTRAVENOUS
  Filled 2015-12-20 (×4): qty 1000

## 2015-12-20 NOTE — Progress Notes (Addendum)
Speech Language Pathology Treatment: Dysphagia  Patient Details Name: Janet Griffin MRN: [id number] DOB: [birth year] Today's Date: 12/20/2015 Time: [id number] SLP Time Calculation (min) (ACUTE ONLY): 20 min  Assessment / Plan / Recommendation Clinical Impression  Pt seen this morning for additional diagnostic PO trials. She presents similarly to yesterday, with her teeth clenched and grinding throughout session. She took a few drops of honey thick liquids from the spoon upon initial offering, which was followed by delayed coughing. Sister attempted to provide additional trials of honey thick liquids and purees by spoon, but pt would not open her mouth to take anything.   Discussed with pt's sister about the option of comfort feeds should she choose to pursue more of focus on comfort going forward. I shared that we could provide the same consistencies that were being offered at her SNF PTA, but that there would be risks associated with this given her current mentation and inability for SLP to assess swallowing function with these textures. I also shared that while this would allow her to provide more opportunities to take in PO, it does not mean that she will. Given that this is likely cognitive in nature, I have doubts that she will be able to sustain herself nutritionally.   Pt's sister says she is interested in accepting the risks of aspiration at this time in order to give pt the opportunity to try PO intake. SLP provided education about the importance of offering POs, but not forcing any intake and not putting anything into her mouth unless she accepts it. Discussed this option with MD since pt has not yet transitioned to comfort care, and she is in agreement. Will trial previous date (Dys 1 textures and honey thick liquids, by spoon only) to assess for any ability to take in POs.    HPI HPI: Patient is a 65 y/o female with hx of CVA with residual right sided weakness, DVT LLE and dementia who is  a long term resident at SNF presents with AMS, increased lethargy and decreased PO intake. Found to have Acute metabolic encephalopathy secondary to AKI in the setting of apparent extreme dehydration and possible UTI. Sepsis protocol initiated. CXR-mild medial left base opacity- atelectasis vs infiltrate. Pt's sister says she was on Dys 1 diet and honey thick liquids at SNF.      SLP Plan  Continue with current plan of care     Recommendations  Diet recommendations: Dysphagia 1 (puree);Honey-thick liquid (comfort feeds) Medication Administration: Via alternative means                Oral Care Recommendations: Oral care QID Follow up Recommendations:  (tba) Plan: Continue with current plan of care       GO                Janet Griffin, Janet Griffin 12/20/2015, 10:38 AM  Janet Griffin, M.A. CCC-SLP [phone]

## 2015-12-20 NOTE — Progress Notes (Signed)
ANTICOAGULATION CONSULT NOTE - Follow Up Consult  Pharmacy Consult for heparin Indication: hx DVT  Allergies  Allergen Reactions  . Penicillins Hives and Anaphylaxis    Patient Measurements: Height: 5\' 7"  (170.2 cm) Weight: 182 lb 8.7 oz (82.8 kg) IBW/kg (Calculated) : 61.6 Heparin Dosing Weight:   Vital Signs: Temp: 98.9 F (37.2 C) (11/08 0700) Temp Source: Axillary (11/08 0700) BP: 121/67 (11/08 0700) Pulse Rate: 84 (11/08 0700)  Labs:  Recent Labs  12/17/15 2018  12/18/15 0409 12/19/15 0705 12/19/15 1927 12/20/15 0500 12/20/15 0728  HGB  --   < > 11.2* 11.4*  --  9.7*  --   HCT  --   --  36.4 36.1  --  30.2*  --   PLT  --   --  181 173  --  178  --   APTT 105*  --  59*  --   --   --   --   HEPARINUNFRC  --   < > 0.56 0.47 0.39  --  0.40  CREATININE  --   --  0.90 0.78  --  0.62  --   < > = values in this interval not displayed.  Estimated Creatinine Clearance: 79.7 mL/min (by C-G formula based on SCr of 0.62 mg/dL).   Medications:  Scheduled:  . aspirin  150 mg Rectal Daily  . chlorhexidine  15 mL Mouth Rinse BID  . famotidine (PEPCID) IV  20 mg Intravenous Q24H  . insulin aspart  0-15 Units Subcutaneous Q4H  . levofloxacin (LEVAQUIN) IV  500 mg Intravenous Q24H  . mouth rinse  15 mL Mouth Rinse q12n4p  . metronidazole  500 mg Intravenous Q8H  . sodium chloride flush  10-40 mL Intracatheter Q12H  . sodium chloride flush  3 mL Intravenous Q12H    Assessment: 65yo on heparin for hx of DVT.  On Eliquis PTA for h/o DVT (LD 11/3), per cards currently risk of bleeding outweighs continuing anticoag. Dr. Catha GosselinMikhail is aware of cardiology concern and ordered heparin gtt for hx DVT.  Heparin level is stable and within goal range this AM.  Discussed decreased Hg since Heparin initiation with DrMikhail, who feels this is in part dilutional.  No bleeding noted per d/w RN.  Goal of Therapy:  Heparin level 0.3-0.7 units/ml Monitor platelets by anticoagulation protocol:  Yes   Plan:  Continue heparin at current rate Watch for s/s of bleeding F/U in AM  Marisue HumbleKendra Miasha Emmons, PharmD Clinical Pharmacist [facility]

## 2015-12-20 NOTE — Progress Notes (Signed)
PROGRESS NOTE    Janet Griffin Merendino  ZOX:[id number] DOB: 1950/02/15 DOA: 12/15/2015 PCP: [place] PoliceSLADE-HARTMAN, [name], MD   Chief Complaint  Patient presents with  . Altered Mental Status    Brief Narrative:  65 year old female from nursing home presented for evaluation of mental status changes. Patient nonverbal at baseline. Found to have severe sepsis, unknown source at this time, acute kidney injury, hypernatremia. Labs improving. Patient also seen by palliative care and has been transitioned to DO NOT RESUSCITATE. Suspect patient will be discharged back to SNF within 24-48 hours.  Assessment & Plan   Severe sepsis secondary to ?UTI versus pneumonia -Upon admission, Noted to be febrile with leukocytosis, elevated troponin, AK I, lactic acid 9.37 -Kidney injury currently improving, lactic acid improving as well. -Was placed on IV fluids, broad-spectrum antibiotics with aztreonam and Levaquin, patient does have a penicillin allergy. Antibiotics changed, currently on flaygl, levaquin.   -Blood and urine cultures show no growth to date -Chest x-ray 11/3 mild medial left base opacity- atelectasis vs infiltrate -Strep pneumonia urine antigen negative -Repeat CXR 11/5: unremarkable for infection -Currently afebrile, leukocytosis improving  Acute metabolic encephalopathy -possible related to infection -poor baseline- spoke with patient's sister at bedside,suspect this is patient is at her baseline at this time.  Acute hypoxic respiratory failure -SpO2 89%, pO2 on ABG 75 -weaned off of nasal canula, currently O2 sats in 100s on room air  Elevated troponin -Likely secondary to sepsis/demand ischemia -Peaked at 1.39, currently trending downward 0.94 -Echocardiogram: EF 45-50%, grade 1 diastolic dysfunction -Cardiology consulted and appreciated- recommended medical management if echocardiogram ok, would not pursue ischemic testing  Acute kidney injury -Secondary to sepsis and  dehydration -Creatinine <1 at baseline, was up to 4.67 upon admission -Currently improving,0.62 -Continue to monitor BMP  Hypernatremia and hyperchloremia -Likely secondary to the above, improving  -Continue IVF, will decrease IVF -Monitor BMP  Essential Hypertension -Continue hydralazine PRN  Possible history CVA -CT head unremarkable -Reviewed prior CTs, no CVAs noted  -Per family, patient became nonverbal last year, started having left sided weakness starting about 8 months ago  History of LLE DVT -patient was on Eliquis -Was placed on IV heparin -Spoke with patient's sister, will discontinue IV heparin as patient has had a drop in hemoglobin  Dysphagia -Per sister, due to abscessed tooth -Will continue to monitor -Spoke with sister at bedside regarding possible need for peg tube If patient does not improve -Speech therapy consulted- will place on dysphagia 1 diet (more comfort feeds?)  Anemia -patient was hemoconcentrated upon admission as hemoglobin was 17 -Currently 9.7 -Suspect baseline around 11-13 -Will discontinue heparin and decrease IVF -Continue to monitor CBC  Hypokalemia -Continue replacement and monitor BMP  Code status -Spoke with sister and son via phone.  Agreeable to palliative care consult.  -Palliative care consulted and appreciated, code status now DNR. ?Possibility of moving towards comfort care once more family and son arrive.   DVT Prophylaxis  heparin  Code Status: DNR  Family Communication: Sister at bedside  Disposition Plan: Admitted. Continue to monitor. When stable, d/c to SNF.  Consultants Cardiology Palliative care  Procedures  Echocardiogram  Antibiotics   Anti-infectives    Start     Dose/Rate Route Frequency Ordered Stop   12/18/15 0000  levofloxacin (LEVAQUIN) IVPB 500 mg  Status:  Discontinued     500 mg 100 mL/hr over 60 Minutes Intravenous Every 48 hours 12/16/15 0012 12/17/15 0845   12/17/15 1230  vancomycin  (VANCOCIN) IVPB 750 mg/150 ml  premix  Status:  Discontinued     750 mg 150 mL/hr over 60 Minutes Intravenous Every 12 hours 12/17/15 1213 12/19/15 1435   12/17/15 1215  metroNIDAZOLE (FLAGYL) IVPB 500 mg     500 mg 100 mL/hr over 60 Minutes Intravenous Every 8 hours 12/17/15 1213     12/17/15 0900  levofloxacin (LEVAQUIN) IVPB 500 mg     500 mg 100 mL/hr over 60 Minutes Intravenous Every 24 hours 12/17/15 0845     12/17/15 0800  aztreonam (AZACTAM) 1 g in dextrose 5 % 50 mL IVPB  Status:  Discontinued     1 g 100 mL/hr over 30 Minutes Intravenous Every 8 hours 12/17/15 0717 12/17/15 1213   12/16/15 0800  aztreonam (AZACTAM) 500 mg in dextrose 5 % 50 mL IVPB  Status:  Discontinued     500 mg 100 mL/hr over 30 Minutes Intravenous Every 8 hours 12/16/15 0012 12/17/15 0717   12/16/15 0015  aztreonam (AZACTAM) 500 mg in dextrose 5 % 50 mL IVPB     500 mg 100 mL/hr over 30 Minutes Intravenous  Once 12/16/15 0012 12/16/15 0151   12/16/15 0015  levofloxacin (LEVAQUIN) IVPB 500 mg     500 mg 100 mL/hr over 60 Minutes Intravenous  Once 12/16/15 0012 12/16/15 0419      Subjective:   Janet Griffin Janet Griffin seen and examined today.  Patient is nonverbal  Objective:   Vitals:   12/19/15 2311 12/20/15 0354 12/20/15 0500 12/20/15 0700  BP: (!) 94/56 110/67  121/67  Pulse: 81 84  84  Resp: (!) 34 (!) 36  (!) 26  Temp: 98.5 F (36.9 C) 98.8 F (37.1 C)  98.9 F (37.2 C)  TempSrc: Axillary Axillary  Axillary  SpO2: 100%   100%  Weight:   82.8 kg (182 lb 8.7 oz)   Height:        Intake/Output Summary (Last 24 hours) at 12/20/15 1037 Last data filed at 12/20/15 0700  Gross per 24 hour  Intake          2111.75 ml  Output             1150 ml  Net           961.75 ml   Filed Weights   12/18/15 0500 12/19/15 0500 12/20/15 0500  Weight: 75 kg (165 lb 5.5 oz) 81.7 kg (180 lb 1.9 oz) 82.8 kg (182 lb 8.7 oz)    Exam (No change)  General: Well developed, well nourished, no distress  HEENT:  NCAT, mucous membranes moist  Cardiovascular: S1 S2 auscultated, no murmurs, RRR  Respiratory: Diminished but clear   Abdomen: Soft, nontender, nondistended, + bowel sounds, well-healed abd scar  Extremities: warm dry without cyanosis clubbing or edema. Area on RLE- currently wrapped  Neuro: Cannot assess  Psych: Cannot assess   Data Reviewed: I have personally reviewed following labs and imaging studies  CBC:  Recent Labs Lab 12/15/15 2032 12/17/15 0515 12/18/15 0409 12/19/15 0705 12/20/15 0500  WBC 16.2* 18.7* 18.1* 13.9* 11.7*  NEUTROABS 14.2*  --   --   --   --   HGB 17.8* 13.3 11.2* 11.4* 9.7*  HCT 55.5* 42.4 36.4 36.1 30.2*  MCV 101.1* 100.7* 100.3* 100.0 97.7  PLT 284 205 181 173 178   Basic Metabolic Panel:  Recent Labs Lab 12/16/15 1255 12/17/15 0515 12/17/15 0852 12/18/15 0409 12/19/15 0705 12/20/15 0500  NA 162* 161*  --  157* 148* 141  K 2.9* 2.8*  --  3.3* 4.6 3.3*  CL >130* 129*  --  125* 120* 113*  CO2 22 23  --  24 22 22   GLUCOSE 211* 138*  --  175* 154* 157*  BUN 71* 44*  --  18 8 5*  CREATININE 2.19* 1.27*  --  0.90 0.78 0.62  CALCIUM 8.5* 8.4*  --  8.1* 8.0* 7.5*  MG  --   --  2.2  --   --   --    GFR: Estimated Creatinine Clearance: 79.7 mL/min (by C-G formula based on SCr of 0.62 mg/dL). Liver Function Tests:  Recent Labs Lab 12/15/15 2032 12/16/15 0030  AST 112* 94*  ALT 156* 127*  ALKPHOS 77 62  BILITOT 3.8* 2.9*  PROT 7.8 6.9  ALBUMIN 3.9 3.5   No results for input(s): LIPASE, AMYLASE in the last 168 hours. No results for input(s): AMMONIA in the last 168 hours. Coagulation Profile:  Recent Labs Lab 12/16/15 0328  INR 1.40   Cardiac Enzymes:  Recent Labs Lab 12/16/15 0030 12/16/15 0328 12/16/15 0516 12/16/15 0728  TROPONINI 1.39* 1.28* 0.97* 0.94*   BNP (last 3 results) No results for input(s): PROBNP in the last 8760 hours. HbA1C: No results for input(s): HGBA1C in the last 72 hours. CBG:  Recent  Labs Lab 12/19/15 1608 12/19/15 1947 12/19/15 2311 12/20/15 0356 12/20/15 0836  GLUCAP 138* 111* 135* 151* 126*   Lipid Profile: No results for input(s): CHOL, HDL, LDLCALC, TRIG, CHOLHDL, LDLDIRECT in the last 72 hours. Thyroid Function Tests: No results for input(s): TSH, T4TOTAL, FREET4, T3FREE, THYROIDAB in the last 72 hours. Anemia Panel: No results for input(s): VITAMINB12, FOLATE, FERRITIN, TIBC, IRON, RETICCTPCT in the last 72 hours. Urine analysis:    Component Value Date/Time   COLORURINE AMBER (A) 12/15/2015 2216   APPEARANCEUR CLOUDY (A) 12/15/2015 2216   LABSPEC 1.013 12/15/2015 2216   PHURINE 5.0 12/15/2015 2216   GLUCOSEU NEGATIVE 12/15/2015 2216   HGBUR LARGE (A) 12/15/2015 2216   BILIRUBINUR SMALL (A) 12/15/2015 2216   KETONESUR NEGATIVE 12/15/2015 2216   PROTEINUR NEGATIVE 12/15/2015 2216   UROBILINOGEN 2.0 (H) 09/08/2012 2138   NITRITE NEGATIVE 12/15/2015 2216   LEUKOCYTESUR TRACE (A) 12/15/2015 2216   Sepsis Labs: @LABRCNTIP (procalcitonin:4,lacticidven:4)  ) Recent Results (from the past 240 hour(s))  Blood Culture (routine x 2)     Status: None (Preliminary result)   Collection Time: 12/15/15  8:32 PM  Result Value Ref Range Status   Specimen Description BLOOD LEFT ANTECUBITAL  Final   Special Requests BOTTLES DRAWN AEROBIC AND ANAEROBIC 5CC  Final   Culture NO GROWTH 4 DAYS  Final   Report Status PENDING  Incomplete  Blood Culture (routine x 2)     Status: None (Preliminary result)   Collection Time: 12/15/15  9:10 PM  Result Value Ref Range Status   Specimen Description BLOOD RIGHT HAND  Final   Special Requests IN PEDIATRIC BOTTLE 4CC  Final   Culture NO GROWTH 4 DAYS  Final   Report Status PENDING  Incomplete  Urine culture     Status: None   Collection Time: 12/15/15 10:16 PM  Result Value Ref Range Status   Specimen Description URINE, CATHETERIZED  Final   Special Requests NONE  Final   Culture NO GROWTH  Final   Report Status  12/17/2015 FINAL  Final  MRSA PCR Screening     Status: None   Collection Time: 12/16/15  6:30 AM  Result Value Ref Range Status   MRSA  by PCR NEGATIVE NEGATIVE Final    Comment:        The GeneXpert MRSA Assay (FDA approved for NASAL specimens only), is one component of a comprehensive MRSA colonization surveillance program. It is not intended to diagnose MRSA infection nor to guide or monitor treatment for MRSA infections.       Radiology Studies: No results found.   Scheduled Meds: . aspirin  150 mg Rectal Daily  . chlorhexidine  15 mL Mouth Rinse BID  . famotidine (PEPCID) IV  20 mg Intravenous Q24H  . insulin aspart  0-15 Units Subcutaneous Q4H  . levofloxacin (LEVAQUIN) IV  500 mg Intravenous Q24H  . mouth rinse  15 mL Mouth Rinse q12n4p  . metronidazole  500 mg Intravenous Q8H  . sodium chloride flush  10-40 mL Intracatheter Q12H  . sodium chloride flush  3 mL Intravenous Q12H   Continuous Infusions: . dextrose 100 mL/hr at 12/20/15 0634  . heparin 750 Units/hr (12/19/15 1900)     LOS: 5 days   Time Spent in minutes   30 minutes  Maurissa Ambrose D.O. on 12/20/2015 at 10:37 AM  Between 7am to 7pm - Pager - [phone]  After 7pm go to [email or web address] - password [email or web address]  And look for the night coverage person covering for me after hours  [facility] Office  [phone]

## 2015-12-20 NOTE — Progress Notes (Signed)
Initial Nutrition Assessment  DOCUMENTATION CODES:   Not applicable  INTERVENTION:    Monitor for diet advancement and adequacy of PO intake.  Follow for overall goals of care.   NUTRITION DIAGNOSIS:   Inadequate oral intake related to inability to eat, dysphagia as evidenced by NPO status.  GOAL:   Patient will meet greater than or equal to 90% of their needs  MONITOR:   Diet advancement, PO intake, Skin, I & O's, Labs  REASON FOR ASSESSMENT:   Low Braden    ASSESSMENT:   65 y.o. woman with a history of CVA with right sided weakness and dementia (she is nonverbal at baseline) who is a long term resident of a SNF.  She has been transferred to the ED for evaluation of mental status changes including increased lethargy (sleeping more), decreased PO intake, decreased eye contact with family.  Unable to complete nutrition focused physical exam at this time.  SLP is following for ability to advance PO diet. Patient did not open mouth to take any PO's yesterday. SLP is re-attempting a bedside swallow evaluation this morning. Patient was on a dysphagia 1 (pureed) diet with honey thick liquids PTA. Palliative Care consult has been ordered. Awaiting another family member for meeting regarding goals of care. Labs reviewed potassium low at 3.3. Medications reviewed and include Flagyl, Heparin.  Diet Order:  Diet NPO time specified  Skin:  Wound (see comment) (unstageable wounds to L heel and foot)  Last BM:  11/7  Height:   Ht Readings from Last 1 Encounters:  12/15/15 5\' 7"  (1.702 m)    Weight:   Wt Readings from Last 1 Encounters:  12/20/15 182 lb 8.7 oz (82.8 kg)    Ideal Body Weight:  61.4 kg  BMI:  Body mass index is 28.59 kg/m.  Estimated Nutritional Needs:   Kcal:  1600-1800  Protein:  85-95 gm  Fluid:  >/= 1.8 L  EDUCATION NEEDS:   No education needs identified at this time  Joaquin CourtsKimberly Tianni Escamilla, RD, LDN, CNSC Pager [phone] After Hours Pager  [phone]

## 2015-12-21 DIAGNOSIS — J690 Pneumonitis due to inhalation of food and vomit: Secondary | ICD-10-CM

## 2015-12-21 LAB — BASIC METABOLIC PANEL
Anion gap: 4 — ABNORMAL LOW (ref 5–15)
CHLORIDE: 108 mmol/L (ref 101–111)
CO2: 24 mmol/L (ref 22–32)
Calcium: 7.4 mg/dL — ABNORMAL LOW (ref 8.9–10.3)
Creatinine, Ser: 0.61 mg/dL (ref 0.44–1.00)
GFR calc Af Amer: >60 mL/min (ref >60)
GFR calc non Af Amer: >60 mL/min (ref >60)
GLUCOSE: 306 mg/dL — AB (ref 65–99)
POTASSIUM: 5 mmol/L (ref 3.5–5.1)
Sodium: 136 mmol/L (ref 135–145)

## 2015-12-21 LAB — GLUCOSE, CAPILLARY
GLUCOSE-CAPILLARY: 116 mg/dL — AB (ref 65–99)
GLUCOSE-CAPILLARY: 123 mg/dL — AB (ref 65–99)
GLUCOSE-CAPILLARY: 127 mg/dL — AB (ref 65–99)
GLUCOSE-CAPILLARY: 129 mg/dL — AB (ref 65–99)
GLUCOSE-CAPILLARY: 80 mg/dL (ref 65–99)
GLUCOSE-CAPILLARY: 94 mg/dL (ref 65–99)
Glucose-Capillary: 129 mg/dL — ABNORMAL HIGH (ref 65–99)
Glucose-Capillary: 77 mg/dL (ref 65–99)

## 2015-12-21 LAB — CBC
HCT: 29.5 % — ABNORMAL LOW (ref 36.0–46.0)
HEMOGLOBIN: 9.6 g/dL — AB (ref 12.0–15.0)
MCH: 31.6 pg (ref 26.0–34.0)
MCHC: 32.5 g/dL (ref 30.0–36.0)
MCV: 97 fL (ref 78.0–100.0)
Platelets: 163 10*3/uL (ref 150–400)
RBC: 3.04 MIL/uL — AB (ref 3.87–5.11)
RDW: 13.9 % (ref 11.5–15.5)
WBC: 7.7 10*3/uL (ref 4.0–10.5)

## 2015-12-21 LAB — HEPARIN LEVEL (UNFRACTIONATED): Heparin Unfractionated: <0.1 IU/mL — ABNORMAL LOW (ref 0.30–0.70)

## 2015-12-21 MED ORDER — MORPHINE SULFATE (CONCENTRATE) 10 MG/0.5ML PO SOLN: 5.0000 mg | ORAL | Status: DC | PRN

## 2015-12-21 MED ORDER — RESOURCE THICKENUP CLEAR PO POWD
ORAL | Status: DC | PRN
  Filled 2015-12-21: qty 125

## 2015-12-21 NOTE — Progress Notes (Addendum)
Daily Progress Note   Patient Name: Janet Griffin       Date: 12/21/2015 DOB: Nov 24, 1950  Age: 65 y.o. MRN#: [id number] Attending Physician: Janet Griffin, * Primary Care Physician: Janet Griffin Admit Date: 12/15/2015  Reason for Consultation/Follow-up: Establishing goals of care  Subjective: Patient in bed, asleep. Arouses easily. She continues to grind her teeth, nonverbal responses, does not follow commands. Noted Griffin's note indicating possible discharge tomorrow. Spoke with patient's sister, Janet Griffin, via phone. Discussed benefits of Hospice services at SNF. Janet Griffin expressed concern re: patient being discharged without having resumed eating. Again reeducated re: dementia trajectory and the cessation of intake related to dementia progression and end of life. Janet Griffin expressed understanding. Agreed to Hospice services at SNF. Janet Griffin stated patient was indicating pain in her head last night and that a CT was ordered. Reviewed CT results with Janet Griffin- advanced atrophy with chronic microvascular disease; no acute intracranial abnormality. Discussed use of pain medication for comfort. Janet Griffin states morphine does provide relief.    Review of Systems  Unable to perform ROS: Patient nonverbal    Length of Stay: 6  Current Medications: Scheduled Meds:  . aspirin  150 mg Rectal Daily  . chlorhexidine  15 mL Mouth Rinse BID  . famotidine (PEPCID) IV  20 mg Intravenous Q24H  . insulin aspart  0-15 Units Subcutaneous Q4H  . levofloxacin (LEVAQUIN) IV  500 mg Intravenous Q24H  . mouth rinse  15 mL Mouth Rinse q12n4p  . sodium chloride flush  10-40 mL Intracatheter Q12H  . sodium chloride flush  3 mL Intravenous Q12H    Continuous Infusions:   PRN Meds: acetaminophen **OR**  acetaminophen, hydrALAZINE, morphine injection, ondansetron **OR** ondansetron (ZOFRAN) IV, RESOURCE THICKENUP CLEAR, sodium chloride flush  Physical Exam  Constitutional: She appears well-developed and well-nourished.  HENT:  Head: Normocephalic and atraumatic.  Cardiovascular: Normal rate and regular rhythm.   Pulmonary/Chest: Effort normal. She has wheezes. She has rales.  Abdominal: Soft. Bowel sounds are normal.  Musculoskeletal: She exhibits edema.  R hand contracted, edema  Neurological:  Nonverbal, does not follow commands  Skin: Skin is warm and dry.  R LE with gauze dressing in place  Psychiatric:  Becomes agitated and tense at times  Nursing note and vitals reviewed.  Vital Signs: BP 110/62   Pulse 83   Temp 98.5 F (36.9 C)   Resp 20   Ht 5\' 7"  (1.702 m)   Wt 83 kg (182 lb 15.7 oz)   SpO2 99%   BMI 28.66 kg/m  SpO2: SpO2: 99 % O2 Device: O2 Device: Not Delivered O2 Flow Rate: O2 Flow Rate (L/min): 1 L/min  Intake/output summary:  Intake/Output Summary (Last 24 hours) at 12/21/15 1208 Last data filed at 12/21/15 0854  Gross per 24 hour  Intake           1842.5 ml  Output             1550 ml  Net            292.5 ml   LBM: Last BM Date: 12/20/15 Baseline Weight: Weight: 81.6 kg (180 lb) Most recent weight: Weight: 83 kg (182 lb 15.7 oz)       Palliative Assessment/Data: PPS: 10%      Patient Active Problem List   Diagnosis Date Noted  . Frontotemporal brain disease   . Intravenous infiltration   . Palliative care by specialist   . Goals of care, counseling/discussion   . Advance care planning   . Pressure injury of skin 12/16/2015  . Encephalopathy   . Sepsis (HCC) 12/15/2015  . Acute kidney injury (HCC) 12/15/2015  . Hypernatremia 12/15/2015  . Acute metabolic encephalopathy 12/15/2015  . Lactic acidosis 12/15/2015  . Pure hypercholesterolemia 11/30/2015  . GERD without esophagitis 11/30/2015  . Essential hypertension 11/30/2015   . History of DVT of lower extremity 11/30/2015    Palliative Care Assessment & Plan   Patient Profile: 65 y.o. 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 oral abscess vs. UTI.   Assessment/Recommendations/Plan   Advanced dementia- now unable to tolerate po intake, patient's sister is agreeable to Hospice services at SNF when discharged  Morphine intensol 5mg  SL for pain and discomfort since patient cannot take po meds  Goals of Care and Additional Recommendations:  Limitations on Scope of Treatment: Avoid Hospitalization, Initiate Comfort Feeding and No Tracheostomy  Code Status:  DNR  Prognosis:   < 4 weeks  d/t no po intake, advanced dementia, PPS: 10%  Discharge Planning:  Skilled Nursing Facility with Hospice  Care plan was discussed with patient's HCPOA- Janet Griffin.   Thank you for allowing the Palliative Medicine Team to assist in the care of this patient.   Time In: 1130 Time Out: 1215 Total Time 45 mins Prolonged Time Billed No      Greater than 50%  of this time was spent counseling and coordinating care related to the above assessment and plan.  Janet Griffin, AGNP-C Palliative Medicine   Please contact Palliative Medicine Team phone at [phone] for questions and concerns.

## 2015-12-21 NOTE — Progress Notes (Signed)
Pharmacy Antibiotic Note  Janet Griffin is a 65 y.o. female admitted on 12/15/2015 with concern for sepsis 2/2 UTI or PNA. Questionable infiltrate on CXR 11/3, repeat CXR 11/5 unremarkable for infection. Pharmacy has been consulted for Levaquin dosing. Also on Flagyl per MD. Antibiotics day #5. Cultures negative. Afebrile, WBC WNL, CrCl ~ 80 mL/min.  Plan: Levaquin 500mg  IV Q24H Flagyl 500mg  IV q8h per MD Monitor clinical progress, c/s, renal function, abx plan/LOT   Height: 5\' 7"  (170.2 cm) Weight: 182 lb 15.7 oz (83 kg) IBW/kg (Calculated) : 61.6  Temp (24hrs), Avg:98.6 F (37 C), Min:98.4 F (36.9 C), Max:99.2 F (37.3 C)   Recent Labs Lab 12/15/15 2053 12/15/15 2337  12/16/15 0044 12/16/15 0516 12/16/15 0728  12/17/15 0515 12/18/15 0409 12/19/15 0705 12/20/15 0500 12/21/15 0400  WBC  --   --   --   --   --   --   --  18.7* 18.1* 13.9* 11.7* 7.7  CREATININE  --   --   < >  --   --  2.72*  < > 1.27* 0.90 0.78 0.62 0.61  LATICACIDVEN 6.75* 9.31*  --  5.22* 5.9* 3.4*  --   --   --   --   --   --   < > = values in this interval not displayed.  Estimated Creatinine Clearance: 79.8 mL/min (by C-G formula based on SCr of 0.61 mg/dL).    Allergies  Allergen Reactions  . Penicillins Hives and Anaphylaxis   Antimicrobials this admission:  11/4 aztreonam >> 11/5 11/4 levaquin >> 11/5 vanc >> 11/7 11/5 flagyl >>   Dose adjustments this admission: n/a   Microbiology results:  11/3 BCx: neg 11/3 UCx: no growth  11/4 MRSA PCR: neg   Janet Griffin, PharmD, BCPS Clinical Pharmacist 12/21/2015 9:42 AM

## 2015-12-21 NOTE — Progress Notes (Signed)
PROGRESS NOTE    Janet Griffin  YQM:[id number] DOB: 1950-06-12 DOA: 12/15/2015 PCP: Keane PoliceSLADE-HARTMAN, [name], MD   Brief Narrative:  65 yo female, non verbal and non ambulatory from the nursing, home. Admitted with sepsis, suspected aspiration pneumonia. Tolerating well medical therapy, aspiration precautions. Possible discharge in am.    Assessment & Plan:   Principal Problem:   Acute kidney injury (HCC) Active Problems:   Essential hypertension   History of DVT of lower extremity   Sepsis (HCC)   Hypernatremia   Acute metabolic encephalopathy   Lactic acidosis   Pressure injury of skin   Encephalopathy   Frontotemporal brain disease   Intravenous infiltration   Palliative care by specialist   Goals of care, counseling/discussion   Advance care planning   1. Sepsis due to pneumonia, possible aspiration. Present on admission. Will continue aspiration precautions, continue antibiotic therapy with levofloxacin. Patient has remain afebrile, will continue to follow on cell count. Cultures no growth. Personally reviewed CT abdomen, lung windows, noted infiltrate on the left base. Urine wit 6 to 30 wbc, cultures no growth.   2. Metabolic encephalopathy. Will continue neuro checks per unit protocol, aspiration precautions. Apparently patient is at her baseline, non verbal and non ambulatory. Noted right upper extremity paresis.  3, T2DM. Will continue glucose cover and monitoring with insulin sliding scale, capillary glucose 129-77. Patient tolerating po with assistance.   4. AKI. Improved renal function with cr stable at 0.61 with K at 5,0, will follow on renal panel in am, avoid hypotension or nephrotoxic medications.   5. HTN. Blood pressure stable, will continue with as needed hydralazine.   6. Hypernatremia. Na down to 136 from 148, will hold on IV D5W for now and follow on renal panel in am, encourage po intake.  7. DVT/ anemia . Holding anticoagulation due to drop in hb and  hct, no clinical signs of bleeding, will follow on H/H in am, check iron stores, if stable will resume apixaban.  8. Dysphagia. Poor oral intake, will continue feedings with assistance, pureed diet.     DVT prophylaxis: lovenox  Code Status: full  Family Communication: No family at the bedside. Disposition Plan: home    Consultants:     Procedures:   Antimicrobials:  Levofloxacin Metronidazole    Subjective: Not verbal, patient not ambulatory, po feedings with assistance. All information from nursing. No fever or chills.   Objective: Vitals:   12/20/15 1558 12/20/15 1914 12/20/15 2208 12/21/15 0413  BP: 116/88 117/64 107/64 110/62  Pulse: 65 84 84 83  Resp: 13 (!) 24 19 20   Temp: 98.4 F (36.9 C) 99.2 F (37.3 C) 98.6 F (37 C) 98.5 F (36.9 C)  TempSrc: Oral Axillary Axillary   SpO2: 94% 100% 100% 99%  Weight:    83 kg (182 lb 15.7 oz)  Height:        Intake/Output Summary (Last 24 hours) at 12/21/15 1047 Last data filed at 12/21/15 0854  Gross per 24 hour  Intake           1842.5 ml  Output             2051 ml  Net           -208.5 ml   Filed Weights   12/19/15 0500 12/20/15 0500 12/21/15 0413  Weight: 81.7 kg (180 lb 1.9 oz) 82.8 kg (182 lb 8.7 oz) 83 kg (182 lb 15.7 oz)    Examination:  General exam: deconditioned, not in pain,  no dyspnea E ENT: no pallor or icterus.  Respiratory system: Poor inspiratory effort, no wheezing, positive scattered rhonchi.  Cardiovascular system: S1 & S2 heard, RRR. No JVD, murmurs, rubs, gallops or clicks. No pedal edema. Gastrointestinal system: Abdomen is nondistended, soft and nontender. No organomegaly or masses felt. Normal bowel sounds heard. Central nervous system: opens to eyes to touch, not following commands, noted right upper extremity paresis, spontaneous movement on the left upper extremities, not moving lower extremities.  Extremities: not able to assess.  Skin: No rashes, lesions or ulcers     Data  Reviewed: I have personally reviewed following labs and imaging studies  CBC:  Recent Labs Lab 12/15/15 2032 12/17/15 0515 12/18/15 0409 12/19/15 0705 12/20/15 0500 12/21/15 0400  WBC 16.2* 18.7* 18.1* 13.9* 11.7* 7.7  NEUTROABS 14.2*  --   --   --   --   --   HGB 17.8* 13.3 11.2* 11.4* 9.7* 9.6*  HCT 55.5* 42.4 36.4 36.1 30.2* 29.5*  MCV 101.1* 100.7* 100.3* 100.0 97.7 97.0  PLT 284 205 181 173 178 163   Basic Metabolic Panel:  Recent Labs Lab 12/17/15 0515 12/17/15 0852 12/18/15 0409 12/19/15 0705 12/20/15 0500 12/21/15 0400  NA 161*  --  157* 148* 141 136  K 2.8*  --  3.3* 4.6 3.3* 5.0  CL 129*  --  125* 120* 113* 108  CO2 23  --  24 22 22 24   GLUCOSE 138*  --  175* 154* 157* 306*  BUN 44*  --  18 8 5* <5*  CREATININE 1.27*  --  0.90 0.78 0.62 0.61  CALCIUM 8.4*  --  8.1* 8.0* 7.5* 7.4*  MG  --  2.2  --   --   --   --    GFR: Estimated Creatinine Clearance: 79.8 mL/min (by C-G formula based on SCr of 0.61 mg/dL). Liver Function Tests:  Recent Labs Lab 12/15/15 2032 12/16/15 0030  AST 112* 94*  ALT 156* 127*  ALKPHOS 77 62  BILITOT 3.8* 2.9*  PROT 7.8 6.9  ALBUMIN 3.9 3.5   No results for input(s): LIPASE, AMYLASE in the last 168 hours. No results for input(s): AMMONIA in the last 168 hours. Coagulation Profile:  Recent Labs Lab 12/16/15 0328  INR 1.40   Cardiac Enzymes:  Recent Labs Lab 12/16/15 0030 12/16/15 0328 12/16/15 0516 12/16/15 0728  TROPONINI 1.39* 1.28* 0.97* 0.94*   BNP (last 3 results) No results for input(s): PROBNP in the last 8760 hours. HbA1C: No results for input(s): HGBA1C in the last 72 hours. CBG:  Recent Labs Lab 12/20/15 1913 12/21/15 0002 12/21/15 0412 12/21/15 0442 12/21/15 0733  GLUCAP 98 116* 123* 127* 129*   Lipid Profile: No results for input(s): CHOL, HDL, LDLCALC, TRIG, CHOLHDL, LDLDIRECT in the last 72 hours. Thyroid Function Tests: No results for input(s): TSH, T4TOTAL, FREET4, T3FREE,  THYROIDAB in the last 72 hours. Anemia Panel: No results for input(s): VITAMINB12, FOLATE, FERRITIN, TIBC, IRON, RETICCTPCT in the last 72 hours. Sepsis Labs:  Recent Labs Lab 12/15/15 2337 12/16/15 0044 12/16/15 0328 12/16/15 0516 12/16/15 0728  PROCALCITON  --   --  2.46  --   --   LATICACIDVEN 9.31* 5.22*  --  5.9* 3.4*    Recent Results (from the past 240 hour(s))  Blood Culture (routine x 2)     Status: None   Collection Time: 12/15/15  8:32 PM  Result Value Ref Range Status   Specimen Description BLOOD LEFT ANTECUBITAL  Final   Special Requests BOTTLES DRAWN AEROBIC AND ANAEROBIC 5CC  Final   Culture NO GROWTH 5 DAYS  Final   Report Status 12/20/2015 FINAL  Final  Blood Culture (routine x 2)     Status: None   Collection Time: 12/15/15  9:10 PM  Result Value Ref Range Status   Specimen Description BLOOD RIGHT HAND  Final   Special Requests IN PEDIATRIC BOTTLE 4CC  Final   Culture NO GROWTH 5 DAYS  Final   Report Status 12/20/2015 FINAL  Final  Urine culture     Status: None   Collection Time: 12/15/15 10:16 PM  Result Value Ref Range Status   Specimen Description URINE, CATHETERIZED  Final   Special Requests NONE  Final   Culture NO GROWTH  Final   Report Status 12/17/2015 FINAL  Final  MRSA PCR Screening     Status: None   Collection Time: 12/16/15  6:30 AM  Result Value Ref Range Status   MRSA by PCR NEGATIVE NEGATIVE Final    Comment:        The GeneXpert MRSA Assay (FDA approved for NASAL specimens only), is one component of a comprehensive MRSA colonization surveillance program. It is not intended to diagnose MRSA infection nor to guide or monitor treatment for MRSA infections.          Radiology Studies: No results found.      Scheduled Meds: . aspirin  150 mg Rectal Daily  . chlorhexidine  15 mL Mouth Rinse BID  . famotidine (PEPCID) IV  20 mg Intravenous Q24H  . insulin aspart  0-15 Units Subcutaneous Q4H  . levofloxacin (LEVAQUIN)  IV  500 mg Intravenous Q24H  . mouth rinse  15 mL Mouth Rinse q12n4p  . metronidazole  500 mg Intravenous Q8H  . sodium chloride flush  10-40 mL Intracatheter Q12H  . sodium chloride flush  3 mL Intravenous Q12H   Continuous Infusions: . dextrose 5 % 1,000 mL with potassium chloride 40 mEq infusion 75 mL/hr at 12/21/15 0126     LOS: 6 days      Janet Griffin Janet Gulaaniel Dejanique Ruehl, MD [facility] Pager [phone]  If 7PM-7AM, please contact night-coverage [email or web address] Password [email or web address] 12/21/2015, 10:47 AM

## 2015-12-21 NOTE — Consult Note (Signed)
WOC Nurse wound follow-up consult note Wound type: Right leg thickness wound. 100% red moist woundbed, measurements unchanged from previous assessment, small amt yellow drainage, no odor Periwound: Generalized edema and erythremia to RLE Dressing procedure/placement/frequency: Continue present plan of care with Xeroform gauze to promote drying and healing of the wound bed, ABD pad and kerlex to absorb drainage, pt already has a Prevalon boot to reduce pressure. No family present to discuss plan of care. Please re-consult if further assistance is needed.  Thank-you,  Cammie Mcgeeawn Cybil Senegal MSN, RN, CWOCN, [place]AP, CNS [phone]

## 2015-12-21 NOTE — Clinical Social Work Note (Signed)
Patient transferred from 3S to 6N. Handoff information given to 6N CSW.   This CSW signing off.  Sariyah Corcino, CSW [phone]  

## 2015-12-21 NOTE — Progress Notes (Signed)
Brief Nutrition Follow-Up Note  Pt transferred from SDU to surgical floor on 12/20/15.  Reviewed SLP note on 12/20/15. Family is interested in accepting the risks of aspiration and pursing comfort feeds. Pt was upgraded to dysphagia 1 diet with honey thick liquids. RD will add Magic Cup to trays to assist with meeting nutritional goals. Pt does not desire artifical means of nutrition and hydration.   Palliative care team following for goals of care discussions. Family is awaiting for son who lives out of town to arrive prior to potential transition to comfort care.   RD will continue to follow for nutritional needs.   Camelia Stelzner A. Mayford KnifeWilliams, RD, LDN, CDE Pager: [phone] After hours Pager: [phone]

## 2015-12-22 LAB — CBC WITH DIFFERENTIAL/PLATELET
BASOS ABS: 0 10*3/uL (ref 0.0–0.1)
Basophils Relative: 0 %
Eosinophils Absolute: 0.2 10*3/uL (ref 0.0–0.7)
Eosinophils Relative: 2 %
HEMATOCRIT: 30.1 % — AB (ref 36.0–46.0)
Hemoglobin: 9.8 g/dL — ABNORMAL LOW (ref 12.0–15.0)
LYMPHS PCT: 30 %
Lymphs Abs: 2.6 10*3/uL (ref 0.7–4.0)
MCH: 31.6 pg (ref 26.0–34.0)
MCHC: 32.6 g/dL (ref 30.0–36.0)
MCV: 97.1 fL (ref 78.0–100.0)
MONO ABS: 1.1 10*3/uL — AB (ref 0.1–1.0)
Monocytes Relative: 12 %
NEUTROS ABS: 4.9 10*3/uL (ref 1.7–7.7)
Neutrophils Relative %: 56 %
Platelets: 192 10*3/uL (ref 150–400)
RBC: 3.1 MIL/uL — AB (ref 3.87–5.11)
RDW: 14 % (ref 11.5–15.5)
WBC: 8.7 10*3/uL (ref 4.0–10.5)

## 2015-12-22 LAB — GLUCOSE, CAPILLARY
GLUCOSE-CAPILLARY: 94 mg/dL (ref 65–99)
GLUCOSE-CAPILLARY: 90 mg/dL (ref 65–99)
GLUCOSE-CAPILLARY: 91 mg/dL (ref 65–99)
Glucose-Capillary: 96 mg/dL (ref 65–99)
Glucose-Capillary: 100 mg/dL — ABNORMAL HIGH (ref 65–99)

## 2015-12-22 LAB — BASIC METABOLIC PANEL
ANION GAP: 5 (ref 5–15)
BUN: <5 mg/dL — ABNORMAL LOW (ref 6–20)
CO2: 18 mmol/L — AB (ref 22–32)
Calcium: 6.1 mg/dL — CL (ref 8.9–10.3)
Chloride: 117 mmol/L — ABNORMAL HIGH (ref 101–111)
Creatinine, Ser: 0.47 mg/dL (ref 0.44–1.00)
GFR calc Af Amer: >60 mL/min (ref >60)
GFR calc non Af Amer: >60 mL/min (ref >60)
GLUCOSE: 200 mg/dL — AB (ref 65–99)
POTASSIUM: 4.2 mmol/L (ref 3.5–5.1)
Sodium: 140 mmol/L (ref 135–145)

## 2015-12-22 LAB — IRON AND TIBC
Iron: 44 ug/dL (ref 28–170)
SATURATION RATIOS: 33 % — AB (ref 10.4–31.8)
TIBC: 133 ug/dL — AB (ref 250–450)
UIBC: 89 ug/dL

## 2015-12-22 LAB — TRANSFERRIN: TRANSFERRIN: 95 mg/dL — AB (ref 192–382)

## 2015-12-22 LAB — FERRITIN: Ferritin: 683 ng/mL — ABNORMAL HIGH (ref 11–307)

## 2015-12-22 MED ORDER — LEVOFLOXACIN 500 MG PO TABS
500.0000 mg | ORAL_TABLET | Freq: Every day | ORAL | Status: DC
  Administered 2015-12-23 – 2015-12-24 (×2): 500 mg via ORAL
  Filled 2015-12-22 (×2): qty 1

## 2015-12-22 MED ORDER — LEVOFLOXACIN 500 MG PO TABS: 500.0000 mg | ORAL_TABLET | Freq: Every day | ORAL | 0 refills | Status: AC

## 2015-12-22 MED ORDER — MORPHINE SULFATE (CONCENTRATE) 10 MG/0.5ML PO SOLN: 5.0000 mg | ORAL | 0 refills | Status: DC | PRN

## 2015-12-22 MED ORDER — ACETAMINOPHEN 325 MG PO TABS: 650.0000 mg | ORAL_TABLET | Freq: Three times a day (TID) | ORAL | 0 refills | Status: AC | PRN

## 2015-12-22 MED ORDER — SODIUM CHLORIDE 0.9 % IV SOLN
1.0000 g | Freq: Once | INTRAVENOUS | Status: AC
  Administered 2015-12-22: 1 g via INTRAVENOUS
  Filled 2015-12-22: qty 10

## 2015-12-22 MED ORDER — SODIUM CHLORIDE 0.45 % IV SOLN
INTRAVENOUS | Status: DC
  Administered 2015-12-22 – 2015-12-25 (×5): via INTRAVENOUS

## 2015-12-22 MED ORDER — ORAL CARE MOUTH RINSE: 15.0000 mL | Freq: Two times a day (BID) | OROMUCOSAL | 0 refills | Status: DC

## 2015-12-22 NOTE — Care Management Note (Addendum)
Case Management Note  Patient Details  Name: Janet Griffin MRN: [id number] Date of Birth: 1950/05/07  Subjective/Objective:   Pt presented for Sepsis. Hx CVA with R sided weakness and dementia. CSW is following for d/c to SNF. Critical Calcium value received by staff RN and d/c postponed.                  Action/Plan: CM did try to reach out to CSW covering [place]. No needs from CM at this time.  Expected Discharge Date:                  Expected Discharge Plan:  Skilled Nursing Facility  In-House Referral:  Clinical Social Work  Discharge planning Services  CM Consult  Post Acute Care Choice:  NA Choice offered to:  NA  DME Arranged:  N/A DME Agency:  NA  HH Arranged:  NA HH Agency:  NA  Status of Service:  Completed, signed off  If discussed at Long Length of Stay Meetings, dates discussed:    Additional Comments: 1409 12-22-15 [name], RN,BSN [phone] CM did get in contact with CSW [name] and the [name] once stable for d/c. No further needs from CM at this time.  [name], [name], RN 12/22/2015, 1:58 PM

## 2015-12-22 NOTE — Discharge Summary (Addendum)
Physician Discharge Summary  Catheryne Deford ZOX:[id number] DOB: 11/01/1950 DOA: 12/15/2015  PCP: Keane Police, MD  Admit date: 12/15/2015 Discharge date: 12/25/2015  Admitted From:  SNF Disposition:  SNF with hospice   Recommendations for Outpatient Follow-up:  1. Follow up with PCP in 1- week.  2. Turn patient every 2 hours 3.          Please encourage by mouth intake 4.          Continue local wound care.   Home Health: NA  Equipment/Devices: NA    Discharge Condition: Stable CODE STATUS: DNR Diet recommendation: Dysphagia 1 (puree);Honey-thick liquid (comfort feeds) Medication Administration: Via alternative means  Brief/Interim Summary: This is a 65 year old female who presented from the skilled nursing facility due to altered mentation, increased lethargy, decreased by mouth intake for 48 hours. She has a significant history for CVA, right-sided weakness and dementia, she is nonverbal, most of the information obtained from her sister. On the initial physical examination she was found to be septic, her blood pressure was 158/90, respiratory rate of 35, heart rate 108, oxygen saturation 100%. Her mucous membranes were dry, her lungs were clear to auscultation with decreased inspiratory effort, heart S1-S2 present tachycardic, abdomen was soft nontender, lower extremities no edema. Sodium 167, potassium 2.7, BUN 94, creatinine 4.6, glucose 224, troponin 1.95, white count 16.2, hemoglobin 17.8, hematocrit 55.5, platelets 284. Urinalysis had 6-30 white cells, her chest x-ray had a left base opacity, CT of the abdomen showed a opacity in the pulmonary left lower lobe.   The patient was admitted to hospital with the working diagnosis of acute metabolic encephalopathy related to acute kidney injury due to dehydration, complicated by sepsis due to possible urinary tract infection/ pneumonia ( aspiration).  1. Severe sepsis. It was determined to be related to healthcare associated  pneumonia, aspiration pneumonia. (Present on admission). Patient was placed on broad-spectrum antibiotics, intravenous fluids. Cultures were obtained and patient was admitted to the progressive care unit. Her cultures were no growth, her antibiotics were descalated to levofloxacin monotherapy that she completed during her hospitalization.  2. Healthcare associated pneumonia due to aspiration (present on admission). Initially patient was treated with aztreonam and levofloxacin, descalated to levofloxacin. Patient had oximetry monitoring and supplemental oxygen. By the time of discharge her oxygen saturation is 100% on room air. She was seen by speech therapy with recommendations for pured / honey thick liquid and aspiration precautions.  3. Acute kidney injury/ hypernatremia, hypokalemia, hypocalcemia. Kidney function improved with IV fluids, it was determined to be prerenal - renal failure. CT of the abdomen show no obstructive uropathy. Her potassium was corrected. Will recommend to continue by mouth intake as tolerated.   4. Metabolic encephalopathy. With supportive care patient's mentation improved back to her baseline, she is nonverbal and mostly noncommunicative. Very poor prognosis. She was seen by palliative care and after discussion with her sister patient will be discharged with hospice services.  5. History of DVT. Patient will resume apixaban per her home regimen.  6. Anemia of chronic disease. Initially patient hemoconcentrated due to her severe dehydration, by the time of discharge her hemoglobin has remained stable around 9.6, 9.8  7. Type 2 diabetes mellitus. Patient was placed on insulin sliding-scale, her capillary glucose has remained normal, capillary  glucose around 80 to  94. By mouth intake with assistance.   8. Skin ulcers. Stage II right lower extremity, frontal region,  proximal leg to proximal foot, not all conference but most of the  anterior aspect. Stage II sacrum ulcer,  about 2 cm length and 0.5 cm wide, clean, with drainage or purulence.   Discharge Diagnoses:  Principal Problem:   Acute kidney injury (HCC) Active Problems:   Essential hypertension   History of DVT of lower extremity   Sepsis (HCC)   Hypernatremia   Acute metabolic encephalopathy   Lactic acidosis   Pressure injury of skin   Encephalopathy   Frontotemporal brain disease   Intravenous infiltration   Palliative care by specialist   Goals of care, counseling/discussion   Advance care planning   Aspiration pneumonia of left lower lobe due to regurgitated food [facility](HCC)    Discharge Instructions  Discharge Instructions    Diet - low sodium heart healthy    Complete by:  As directed    Discharge instructions    Complete by:  As directed    Please follow with hospice services   Increase activity slowly    Complete by:  As directed        Medication List    STOP taking these medications   acetaminophen 650 MG suppository Commonly known as:  TYLENOL Replaced by:  acetaminophen 325 MG tablet   HYDROcodone-acetaminophen 5-325 MG tablet Commonly known as:  NORCO/VICODIN     TAKE these medications   acetaminophen 325 MG tablet Commonly known as:  TYLENOL Take 2 tablets (650 mg total) by mouth every 8 (eight) hours as needed for moderate pain or fever (or Fever >/= 101). Replaces:  acetaminophen 650 MG suppository   albuterol (2.5 MG/3ML) 0.083% nebulizer solution Commonly known as:  PROVENTIL Take 2.5 mg by nebulization every 6 (six) hours as needed for wheezing or shortness of breath.   amLODipine 5 MG tablet Commonly known as:  NORVASC Take 5 mg by mouth daily.   ARTIFICIAL TEARS OP Apply 1 drop to eye 4 (four) times daily as needed.   bisacodyl 10 MG suppository Commonly known as:  DULCOLAX Place 10 mg rectally daily as needed for moderate constipation.   capsaicin 0.025 % cream Commonly known as:  ZOSTRIX Apply 1 application topically 3 (three) times daily.  Apply thin layer to bilateral knees   ELIQUIS 2.5 MG Tabs tablet Generic drug:  apixaban Take 2.5 mg by mouth 2 (two) times daily.   hydrochlorothiazide 12.5 MG capsule Commonly known as:  MICROZIDE Take 12.5 mg by mouth daily.   levofloxacin 500 MG tablet Commonly known as:  LEVAQUIN Take 1 tablet (500 mg total) by mouth daily.   Melatonin 3 MG Tabs Take 1 tablet by mouth at bedtime.   memantine 10 MG tablet Commonly known as:  NAMENDA Take 30 mg by mouth 2 (two) times daily.   morphine CONCENTRATE 10 MG/0.5ML Soln concentrated solution Place 0.25 mLs (5 mg total) under the tongue every 2 (two) hours as needed for moderate pain, severe pain, anxiety or shortness of breath.   mouth rinse Liqd solution 15 mLs by Mouth Rinse route 2 times daily at 12 noon and 4 pm.   polyethylene glycol packet Commonly known as:  MIRALAX / GLYCOLAX Take 17 g by mouth at bedtime.   ranitidine 75 MG tablet Commonly known as:  ZANTAC Take 75 mg by mouth at bedtime.   senna 8.6 MG tablet Commonly known as:  SENOKOT Take 1 tablet by mouth as needed for constipation.   simvastatin 10 MG tablet Commonly known as:  ZOCOR Take 10 mg by mouth daily.   traZODone 100 MG tablet Commonly known as:  DESYREL Take 100 mg by mouth at bedtime.      Follow-up Information    Keane PoliceSLADE-HARTMAN, [name], MD Follow up in 1 week(s).   Specialty:  Family Medicine Contact information: [address] [place] [address] [phone]          Allergies  Allergen Reactions  . Penicillins Hives and Anaphylaxis    Consultations:  Palliative care.    Procedures/Studies: Dg Chest 2 View  Result Date: 12/17/2015 CLINICAL DATA:  Generalized weakness EXAM: CHEST  2 VIEW COMPARISON:  12/15/2015 FINDINGS: Cardiac shadow is stable. The lungs are well aerated bilaterally. No focal infiltrate or sizable effusion is seen. No acute bony abnormality is noted. IMPRESSION: No active cardiopulmonary  disease. Electronically Signed   By: Alcide CleverMark  Lukens M.D.   On: 12/17/2015 14:29   Ct Head Wo Contrast  Result Date: 12/16/2015 CLINICAL DATA:  Encephalopathy and dementia EXAM: CT HEAD WITHOUT CONTRAST TECHNIQUE: Contiguous axial images were obtained from the base of the skull through the vertex without intravenous contrast. COMPARISON:  Head CT 05/15/2014 FINDINGS: Brain: There is advanced, diffuse supratentorial volume loss with associated ex vacuo dilatation of the lateral and third ventricles. There is no acute intracranial hemorrhage. No CT evidence of acute cortical infarct. No midline shift or mass lesion. There is periventricular hypoattenuation compatible with chronic microvascular disease. Vascular: No hyperdense vessel or unexpected calcification. Skull: Normal. Negative for fracture or focal lesion. Sinuses/Orbits: The visualized portions of the paranasal sinuses and mastoid air cells are free of fluid. No advanced mucosal thickening. The visualized orbits are normal. Other: None. IMPRESSION: 1. Advanced atrophy with findings of chronic microvascular disease. 2. No acute intracranial abnormality. Electronically Signed   By: Deatra RobinsonKevin  Herman M.D.   On: 12/16/2015 01:25   Dg Chest Port 1 View  Result Date: 12/15/2015 CLINICAL DATA:  Sepsis EXAM: PORTABLE CHEST 1 VIEW COMPARISON:  09/08/2012 FINDINGS: Small amount of left basilar atelectasis versus mild medial infiltrate. No effusion. Heart size normal. No pneumothorax. IMPRESSION: Mild medial left base opacity could relate to mild atelectasis versus small infiltrate. Electronically Signed   By: Jasmine PangKim  Fujinaga M.D.   On: 12/15/2015 21:06   Ct Renal Stone Study  Result Date: 12/16/2015 CLINICAL DATA:  Acute kidney injury. Concern for sepsis and urinary tract infection. EXAM: CT ABDOMEN AND PELVIS WITHOUT CONTRAST TECHNIQUE: Multidetector CT imaging of the abdomen and pelvis was performed following the standard protocol without IV contrast. COMPARISON:   Pelvic radiograph 09/08/2012 FINDINGS: Lower chest: Respiratory motion somewhat degrades evaluation of the lung bases. There is consolidation versus atelectasis in the left lung base. Milder right basilar atelectasis. No pleural effusion. Hepatobiliary: Normal hepatic size and contours. No perihepatic ascites. No intra- or extrahepatic biliary dilatation. The gallbladder is surgically absent. Pancreas: Normal pancreatic contours. No peripancreatic fluid collection or pancreatic ductal dilatation. Spleen: Small but otherwise normal. Adrenals/Urinary Tract: Normal adrenal glands. No hydronephrosis or nephrolithiasis. No abnormal perinephric stranding. No ureteral obstruction. Stomach/Bowel: No abnormal bowel dilatation. No bowel wall thickening or adjacent fat stranding to indicate acute inflammation. No abdominal fluid collection. Normal appendix. Vascular/Lymphatic: Atherosclerotic calcification of the non aneurysmal abdominal aorta. No abdominal or pelvic adenopathy. Reproductive: Hyperdense lesion at the left uterine fundus measures 3.5 cm. Ovaries are not clearly identified. No free fluid in the pelvis. Musculoskeletal: Right greater than left hip osteoarthrosis. Lower lumbar facet arthrosis and osteophytosis. Small amounts of right lower quadrant subcutaneous gas. Question recent subcutaneous injection (such as Lovenox or insulin). Other: No contributory non-categorized findings.  IMPRESSION: 1. No obstructive uropathy or perinephric stranding. Assessment for pyelonephritis is limited in the absence of IV contrast. 2. Consolidative opacity in the left lower lobe, infection versus atelectasis. 3. Subcutaneous gas in the right lower quadrant is likely iatrogenic. Correlate for recent subcutaneous injection. 4. Aortic atherosclerosis. Electronically Signed   By: Deatra Robinson M.D.   On: 12/16/2015 01:10       Subjective: Patient not in pain or dyspnea, non verbal, all information from nursing.   Discharge  Exam: Vitals:   12/21/15 2019 12/22/15 0438  BP: (!) 94/52 93/60  Pulse: (!) 59 65  Resp: 18 16  Temp: 99.2 F (37.3 C) 98.7 F (37.1 C)   Vitals:   12/21/15 0413 12/21/15 1257 12/21/15 2019 12/22/15 0438  BP: 110/62 (!) 106/57 (!) 94/52 93/60  Pulse: 83 81 (!) 59 65  Resp: 20 19 18 16   Temp: 98.5 F (36.9 C) 99 F (37.2 C) 99.2 F (37.3 C) 98.7 F (37.1 C)  TempSrc:  Axillary Axillary Axillary  SpO2: 99% 100% 100% 100%  Weight: 83 kg (182 lb 15.7 oz)   82.7 kg (182 lb 5.1 oz)  Height:        General: Pt is awake, not in acute distress, not following commands Cardiovascular: RRR, S1/S2 +, no rubs, no gallops Respiratory: CTA bilaterally, no wheezing, no rhonchi, poor inspiratory effort. Abdominal: Soft, NT, ND, bowel sounds + Extremities: trace edema, no cyanosis Skin. With stage 2 ulcer at the sacrum, 2 cm length, no signs of infection. Right leg with stage 2 large ulcerated lesion, dressing in place.     The results of significant diagnostics from this hospitalization (including imaging, microbiology, ancillary and laboratory) are listed below for reference.     Microbiology: Recent Results (from the past 240 hour(s))  Blood Culture (routine x 2)     Status: None   Collection Time: 12/15/15  8:32 PM  Result Value Ref Range Status   Specimen Description BLOOD LEFT ANTECUBITAL  Final   Special Requests BOTTLES DRAWN AEROBIC AND ANAEROBIC 5CC  Final   Culture NO GROWTH 5 DAYS  Final   Report Status 12/20/2015 FINAL  Final  Blood Culture (routine x 2)     Status: None   Collection Time: 12/15/15  9:10 PM  Result Value Ref Range Status   Specimen Description BLOOD RIGHT HAND  Final   Special Requests IN PEDIATRIC BOTTLE 4CC  Final   Culture NO GROWTH 5 DAYS  Final   Report Status 12/20/2015 FINAL  Final  Urine culture     Status: None   Collection Time: 12/15/15 10:16 PM  Result Value Ref Range Status   Specimen Description URINE, CATHETERIZED  Final   Special  Requests NONE  Final   Culture NO GROWTH  Final   Report Status 12/17/2015 FINAL  Final  MRSA PCR Screening     Status: None   Collection Time: 12/16/15  6:30 AM  Result Value Ref Range Status   MRSA by PCR NEGATIVE NEGATIVE Final    Comment:        The GeneXpert MRSA Assay (FDA approved for NASAL specimens only), is one component of a comprehensive MRSA colonization surveillance program. It is not intended to diagnose MRSA infection nor to guide or monitor treatment for MRSA infections.      Labs: BNP (last 3 results)  Recent Labs  12/15/15 2032  BNP 272.2*   Basic Metabolic Panel:  Recent Labs Lab 12/17/15 0852 12/18/15 0409  12/19/15 0705 12/20/15 0500 12/21/15 0400 12/22/15 0529  NA  --  157* 148* 141 136 140  K  --  3.3* 4.6 3.3* 5.0 4.2  CL  --  125* 120* 113* 108 117*  CO2  --  24 22 22 24  18*  GLUCOSE  --  175* 154* 157* 306* 200*  BUN  --  18 8 5* <5* <5*  CREATININE  --  0.90 0.78 0.62 0.61 0.47  CALCIUM  --  8.1* 8.0* 7.5* 7.4* 6.1*  MG 2.2  --   --   --   --   --    Liver Function Tests:  Recent Labs Lab 12/15/15 2032 12/16/15 0030  AST 112* 94*  ALT 156* 127*  ALKPHOS 77 62  BILITOT 3.8* 2.9*  PROT 7.8 6.9  ALBUMIN 3.9 3.5   No results for input(s): LIPASE, AMYLASE in the last 168 hours. No results for input(s): AMMONIA in the last 168 hours. CBC:  Recent Labs Lab 12/15/15 2032  12/18/15 0409 12/19/15 0705 12/20/15 0500 12/21/15 0400 12/22/15 0529  WBC 16.2*  < > 18.1* 13.9* 11.7* 7.7 8.7  NEUTROABS 14.2*  --   --   --   --   --  4.9  HGB 17.8*  < > 11.2* 11.4* 9.7* 9.6* 9.8*  HCT 55.5*  < > 36.4 36.1 30.2* 29.5* 30.1*  MCV 101.1*  < > 100.3* 100.0 97.7 97.0 97.1  PLT 284  < > 181 173 178 163 192  < > = values in this interval not displayed. Cardiac Enzymes:  Recent Labs Lab 12/16/15 0030 12/16/15 0328 12/16/15 0516 12/16/15 0728  TROPONINI 1.39* 1.28* 0.97* 0.94*   BNP: Invalid input(s): POCBNP CBG:  Recent  Labs Lab 12/21/15 1615 12/21/15 2014 12/21/15 2349 12/22/15 0429 12/22/15 0738  GLUCAP 77 80 94 94 96   D-Dimer No results for input(s): DDIMER in the last 72 hours. Hgb A1c No results for input(s): HGBA1C in the last 72 hours. Lipid Profile No results for input(s): CHOL, HDL, LDLCALC, TRIG, CHOLHDL, LDLDIRECT in the last 72 hours. Thyroid function studies No results for input(s): TSH, T4TOTAL, T3FREE, THYROIDAB in the last 72 hours.  Invalid input(s): FREET3 Anemia work up  Recent Labs  12/22/15 0529  FERRITIN 683*  TIBC 133*  IRON 44   Urinalysis    Component Value Date/Time   COLORURINE AMBER (A) 12/15/2015 2216   APPEARANCEUR CLOUDY (A) 12/15/2015 2216   LABSPEC 1.013 12/15/2015 2216   PHURINE 5.0 12/15/2015 2216   GLUCOSEU NEGATIVE 12/15/2015 2216   HGBUR LARGE (A) 12/15/2015 2216   BILIRUBINUR SMALL (A) 12/15/2015 2216   KETONESUR NEGATIVE 12/15/2015 2216   PROTEINUR NEGATIVE 12/15/2015 2216   UROBILINOGEN 2.0 (H) 09/08/2012 2138   NITRITE NEGATIVE 12/15/2015 2216   LEUKOCYTESUR TRACE (A) 12/15/2015 2216   Sepsis Labs Invalid input(s): PROCALCITONIN,  WBC,  LACTICIDVEN Microbiology Recent Results (from the past 240 hour(s))  Blood Culture (routine x 2)     Status: None   Collection Time: 12/15/15  8:32 PM  Result Value Ref Range Status   Specimen Description BLOOD LEFT ANTECUBITAL  Final   Special Requests BOTTLES DRAWN AEROBIC AND ANAEROBIC 5CC  Final   Culture NO GROWTH 5 DAYS  Final   Report Status 12/20/2015 FINAL  Final  Blood Culture (routine x 2)     Status: None   Collection Time: 12/15/15  9:10 PM  Result Value Ref Range Status   Specimen Description BLOOD RIGHT HAND  Final   Special Requests IN PEDIATRIC BOTTLE 4CC  Final   Culture NO GROWTH 5 DAYS  Final   Report Status 12/20/2015 FINAL  Final  Urine culture     Status: None   Collection Time: 12/15/15 10:16 PM  Result Value Ref Range Status   Specimen Description URINE, CATHETERIZED   Final   Special Requests NONE  Final   Culture NO GROWTH  Final   Report Status 12/17/2015 FINAL  Final  MRSA PCR Screening     Status: None   Collection Time: 12/16/15  6:30 AM  Result Value Ref Range Status   MRSA by PCR NEGATIVE NEGATIVE Final    Comment:        The GeneXpert MRSA Assay (FDA approved for NASAL specimens only), is one component of a comprehensive MRSA colonization surveillance program. It is not intended to diagnose MRSA infection nor to guide or monitor treatment for MRSA infections.      Time coordinating discharge: 45 minutes  SIGNED:   Coralie Keens, MD  [facility] 12/22/2015, 10:22 AM Pager   If 7PM-7AM, please contact night-coverage [email or web address] Password [email or web address]

## 2015-12-22 NOTE — Progress Notes (Addendum)
CRITICAL VALUE ALERT  Critical value received:Calcium Level  Date of notification:  12/22/15  Time of notification:  0740  Critical value read back: yes  Nurse who received alert: Alferd ApaJoan Jaydalynn Olivero  MD notified (1st page): Dr. Ella JubileeArrien  Time of first page:  [phone]  MD notified (2nd page):na  Time of second page:na  Responding RU:[id number]:awaiting  Time MD responded:  awaiting

## 2015-12-23 LAB — BASIC METABOLIC PANEL
Anion gap: 6 (ref 5–15)
CALCIUM: 7.7 mg/dL — AB (ref 8.9–10.3)
CO2: 22 mmol/L (ref 22–32)
CREATININE: 0.53 mg/dL (ref 0.44–1.00)
Chloride: 111 mmol/L (ref 101–111)
GFR calc Af Amer: >60 mL/min (ref >60)
Glucose, Bld: 104 mg/dL — ABNORMAL HIGH (ref 65–99)
Potassium: 3.1 mmol/L — ABNORMAL LOW (ref 3.5–5.1)
SODIUM: 139 mmol/L (ref 135–145)

## 2015-12-23 LAB — GLUCOSE, CAPILLARY
GLUCOSE-CAPILLARY: 102 mg/dL — AB (ref 65–99)
GLUCOSE-CAPILLARY: 95 mg/dL (ref 65–99)
GLUCOSE-CAPILLARY: 101 mg/dL — AB (ref 65–99)
Glucose-Capillary: 77 mg/dL (ref 65–99)
Glucose-Capillary: 87 mg/dL (ref 65–99)
Glucose-Capillary: 89 mg/dL (ref 65–99)
Glucose-Capillary: 98 mg/dL (ref 65–99)

## 2015-12-23 MED ORDER — APIXABAN 2.5 MG PO TABS
2.5000 mg | ORAL_TABLET | Freq: Two times a day (BID) | ORAL | Status: DC
  Administered 2015-12-23 – 2015-12-25 (×5): 2.5 mg via ORAL
  Filled 2015-12-23 (×5): qty 1

## 2015-12-23 NOTE — Progress Notes (Addendum)
ANTICOAGULATION CONSULT NOTE - Follow Up Consult  Pharmacy Consult for apixaban Indication:history of DVT  Allergies  Allergen Reactions  . Penicillins Hives and Anaphylaxis    Patient Measurements: Height: 5\' 7"  (170.2 cm) Weight: 184 lb 15.5 oz (83.9 kg) IBW/kg (Calculated) : 61.6  Vital Signs: Temp: 98.2 F (36.8 C) (11/11 0431) Temp Source: Axillary (11/11 0431) BP: 123/74 (11/11 0431) Pulse Rate: 81 (11/11 0431)  Labs:  Recent Labs  12/21/15 0400 12/22/15 0529  HGB 9.6* 9.8*  HCT 29.5* 30.1*  PLT 163 192  HEPARINUNFRC <0.10*  --   CREATININE 0.61 0.47    Estimated Creatinine Clearance: 80.1 mL/min (by C-G formula based on SCr of 0.47 mg/dL).  Assessment: 65 yo female on apixaban for history of DVT prior to admission. Apixaban was stopped to initiate heparin during this admission. Now transitioning back to apixaban. Pt was on 2.5 mg po BID PTA. [facility] she presented to their facility on apixaban for h/o LLE DVT on 11/04/14.  Goal of Therapy:  Monitor platelets by anticoagulation protocol: Yes   Plan:  Reinitiate apixaban 2.5 mg PO BID Monitor for s/sx bleeding, ability to take PO  Allena Katzaroline E Welles, Pharm.D. PGY1 Pharmacy Resident 11/11/20178:32 AM Pager [phone]

## 2015-12-23 NOTE — Progress Notes (Signed)
PROGRESS NOTE    Janet Griffin  ZOX:[id number] DOB: Dec 18, 1950 DOA: 12/15/2015 PCP: Keane Police, MD    Brief Narrative:  65 yo female, non verbal and non ambulatory from the nursing, home. Admitted with sepsis, suspected aspiration pneumonia. Tolerating well medical therapy, aspiration precautions. Discharge held due to poor oral intake. Plan for discharge to snf with hospice. Pending family meeting today.    Assessment & Plan:   Principal Problem:   Acute kidney injury (HCC) Active Problems:   Essential hypertension   History of DVT of lower extremity   Sepsis (HCC)   Hypernatremia   Acute metabolic encephalopathy   Lactic acidosis   Pressure injury of skin   Encephalopathy   Frontotemporal brain disease   Intravenous infiltration   Palliative care by specialist   Goals of care, counseling/discussion   Advance care planning   Aspiration pneumonia of left lower lobe due to regurgitated food (HCC)  1. Sepsis due to pneumonia, possible aspiration. Present on admission. Aspiration precautions, continue antibiotic therapy with levofloxacin, po. Patient has remain afebrile. Patient has hight risk for recurrent pneumonias, will need outpatient follow up with maxillofacial surgery for tooth abscess.   2. Metabolic encephalopathy. Neuro checks per unit protocol, aspiration precautions. Patient more awake this am. Apparently patient is at her baseline, non verbal and non ambulatory. Noted right upper extremity paresis.  3, T2DM. Glucose cover and monitoring with insulin sliding scale, capillary glucose 102-95-101.   4. AKI. Patient with poor oral intake, will continue to encourage po intake, needs assistance for eating, will keep IV fluids for now. Monitor po intake.   5. HTN. Blood pressure stable, will continue with as needed hydralazine. Systolic blood pressure 120's.   6. Hypernatremia. Will continue gentle hydration with IV fluids, patient needs assistance for  feedings, access to free water limited due to neurologic compromise.   7. DVT/ anemia . No signs of bleeding, will resume anticoagulation with apixaban. Iron panel with anemia of chronic disease. No signs of bleeding, no need for blood transfusion  8. Dysphagia. Poor oral intake, will continue feedings with assistance, pureed diet. Patient has been refusing to eat.   9. Hypocalcemia. Given calcium gluconate yesterday. Suspected vitamin D deficiency, will check 25 hydroxy cholecalciferol.     DVT prophylaxis: apixaban.  Code Status: DNR  Family Communication: yesterday I spoke with patient's sister at length about patient's condition and plan of care.  Disposition Plan: SNF with hospice    Consultants:   Palliative care  Procedures:     Antimicrobials:   levofloxacin   Subjective: Patient not verbal, yesterday discharge was held due to patient's poor oral intake. No nausea or vomiting.   Objective: Vitals:   12/22/15 0438 12/22/15 1431 12/22/15 2204 12/23/15 0431  BP: 93/60 115/63 108/60 123/74  Pulse: 65 95 78 81  Resp: 16 16 17 18   Temp: 98.7 F (37.1 C) 98.4 F (36.9 C) 98.3 F (36.8 C) 98.2 F (36.8 C)  TempSrc: Axillary Axillary Axillary Axillary  SpO2: 100% 99% 99% 98%  Weight: 82.7 kg (182 lb 5.1 oz)   83.9 kg (184 lb 15.5 oz)  Height:        Intake/Output Summary (Last 24 hours) at 12/23/15 1004 Last data filed at 12/23/15 0951  Gross per 24 hour  Intake          1394.25 ml  Output              950 ml  Net  444.25 ml   Filed Weights   12/21/15 0413 12/22/15 0438 12/23/15 0431  Weight: 83 kg (182 lb 15.7 oz) 82.7 kg (182 lb 5.1 oz) 83.9 kg (184 lb 15.5 oz)    Examination:  General exam: not in pain or dyspnea E ENT: no pallor or icterus, oral mucosa moist.  Respiratory system: No wheezing, rales or rhonchi. Respiratory effort normal. Cardiovascular system: S1 & S2 heard, RRR. No JVD, murmurs, rubs, gallops or clicks. Non pitting edema  on the right lower extremity.  Gastrointestinal system: Abdomen is nondistended, soft and nontender. No organomegaly or masses felt. Normal bowel sounds heard. Central nervous system: Alert and oriented. No focal neurological deficits. Extremities: Symmetric 5 x 5 power. Skin: No rashes, lesions or ulcers     Data Reviewed: I have personally reviewed following labs and imaging studies  CBC:  Recent Labs Lab 12/18/15 0409 12/19/15 0705 12/20/15 0500 12/21/15 0400 12/22/15 0529  WBC 18.1* 13.9* 11.7* 7.7 8.7  NEUTROABS  --   --   --   --  4.9  HGB 11.2* 11.4* 9.7* 9.6* 9.8*  HCT 36.4 36.1 30.2* 29.5* 30.1*  MCV 100.3* 100.0 97.7 97.0 97.1  PLT 181 173 178 163 192   Basic Metabolic Panel:  Recent Labs Lab 12/17/15 0852 12/18/15 0409 12/19/15 0705 12/20/15 0500 12/21/15 0400 12/22/15 0529  NA  --  157* 148* 141 136 140  K  --  3.3* 4.6 3.3* 5.0 4.2  CL  --  125* 120* 113* 108 117*  CO2  --  24 22 22 24  18*  GLUCOSE  --  175* 154* 157* 306* 200*  BUN  --  18 8 5* <5* <5*  CREATININE  --  0.90 0.78 0.62 0.61 0.47  CALCIUM  --  8.1* 8.0* 7.5* 7.4* 6.1*  MG 2.2  --   --   --   --   --    GFR: Estimated Creatinine Clearance: 80.1 mL/min (by C-G formula based on SCr of 0.47 mg/dL). Liver Function Tests: No results for input(s): AST, ALT, ALKPHOS, BILITOT, PROT, ALBUMIN in the last 168 hours. No results for input(s): LIPASE, AMYLASE in the last 168 hours. No results for input(s): AMMONIA in the last 168 hours. Coagulation Profile: No results for input(s): INR, PROTIME in the last 168 hours. Cardiac Enzymes: No results for input(s): CKTOTAL, CKMB, CKMBINDEX, TROPONINI in the last 168 hours. BNP (last 3 results) No results for input(s): PROBNP in the last 8760 hours. HbA1C: No results for input(s): HGBA1C in the last 72 hours. CBG:  Recent Labs Lab 12/22/15 1558 12/22/15 2028 12/23/15 0024 12/23/15 0421 12/23/15 0814  GLUCAP 90 91 89 102* 95   Lipid  Profile: No results for input(s): CHOL, HDL, LDLCALC, TRIG, CHOLHDL, LDLDIRECT in the last 72 hours. Thyroid Function Tests: No results for input(s): TSH, T4TOTAL, FREET4, T3FREE, THYROIDAB in the last 72 hours. Anemia Panel:  Recent Labs  12/22/15 0529  FERRITIN 683*  TIBC 133*  IRON 44   Sepsis Labs: No results for input(s): PROCALCITON, LATICACIDVEN in the last 168 hours.  Recent Results (from the past 240 hour(s))  Blood Culture (routine x 2)     Status: None   Collection Time: 12/15/15  8:32 PM  Result Value Ref Range Status   Specimen Description BLOOD LEFT ANTECUBITAL  Final   Special Requests BOTTLES DRAWN AEROBIC AND ANAEROBIC 5CC  Final   Culture NO GROWTH 5 DAYS  Final   Report Status 12/20/2015 FINAL  Final  Blood Culture (routine x 2)     Status: None   Collection Time: 12/15/15  9:10 PM  Result Value Ref Range Status   Specimen Description BLOOD RIGHT HAND  Final   Special Requests IN PEDIATRIC BOTTLE 4CC  Final   Culture NO GROWTH 5 DAYS  Final   Report Status 12/20/2015 FINAL  Final  Urine culture     Status: None   Collection Time: 12/15/15 10:16 PM  Result Value Ref Range Status   Specimen Description URINE, CATHETERIZED  Final   Special Requests NONE  Final   Culture NO GROWTH  Final   Report Status 12/17/2015 FINAL  Final  MRSA PCR Screening     Status: None   Collection Time: 12/16/15  6:30 AM  Result Value Ref Range Status   MRSA by PCR NEGATIVE NEGATIVE Final    Comment:        The GeneXpert MRSA Assay (FDA approved for NASAL specimens only), is one component of a comprehensive MRSA colonization surveillance program. It is not intended to diagnose MRSA infection nor to guide or monitor treatment for MRSA infections.          Radiology Studies: No results found.      Scheduled Meds: . apixaban  2.5 mg Oral BID  . aspirin  150 mg Rectal Daily  . chlorhexidine  15 mL Mouth Rinse BID  . famotidine (PEPCID) IV  20 mg Intravenous  Q24H  . insulin aspart  0-15 Units Subcutaneous Q4H  . levofloxacin  500 mg Oral Daily  . mouth rinse  15 mL Mouth Rinse q12n4p  . sodium chloride flush  10-40 mL Intracatheter Q12H  . sodium chloride flush  3 mL Intravenous Q12H   Continuous Infusions: . sodium chloride 75 mL/hr at 12/23/15 0337     LOS: 8 days      Laretha Luepke Annett Gulaaniel Brennan Litzinger, MD [facility] Pager [phone]  If 7PM-7AM, please contact night-coverage [email or web address] Password [email or web address] 12/23/2015, 10:04 AM

## 2015-12-23 NOTE — Plan of Care (Signed)
Problem: Nutrition: Goal: Adequate nutrition will be maintained Outcome: Not Met (add Reason) Slow to progress, only eating a few bites.

## 2015-12-24 DIAGNOSIS — E876 Hypokalemia: Secondary | ICD-10-CM

## 2015-12-24 LAB — GLUCOSE, CAPILLARY
Glucose-Capillary: 86 mg/dL (ref 65–99)
Glucose-Capillary: 95 mg/dL (ref 65–99)
Glucose-Capillary: 94 mg/dL (ref 65–99)
Glucose-Capillary: 79 mg/dL (ref 65–99)
Glucose-Capillary: 71 mg/dL (ref 65–99)

## 2015-12-24 MED ORDER — SODIUM CHLORIDE 0.9 % IV SOLN
30.0000 meq | Freq: Once | INTRAVENOUS | Status: AC
  Administered 2015-12-24: 30 meq via INTRAVENOUS
  Filled 2015-12-24: qty 15

## 2015-12-24 NOTE — Plan of Care (Signed)
Problem: Education: Goal: Knowledge of [facility] Education information/materials will improve Outcome: Completed/Met Date Met: 12/24/15 Patient has dementia, family has understanding of all  Problem: Safety: Goal: Ability to remain free from injury will improve Outcome: Completed/Met Date Met: 12/24/15 With patient's family

## 2015-12-24 NOTE — Progress Notes (Addendum)
PROGRESS NOTE    Janet RoutLennetta Griffin  ZOX:[id number]:[id number] DOB: [birth year] DOA: 12/15/2015 PCP: Keane PoliceSLADE-HARTMAN, [name], MD   Brief Narrative:   65 yo female, non verbal and non ambulatory from the nursing, home. Admitted with sepsis, suspected aspiration pneumonia. Tolerating well medical therapy, aspiration precautions. Discharge held due to poor oral intake. Plan for discharge to snf with hospice. Family meeting, with patient's son and sister, plan to continue hospice, no feeding tube.   Assessment & Plan:   Principal Problem:   Acute kidney injury (HCC) Active Problems:   Essential hypertension   History of DVT of lower extremity   Sepsis (HCC)   Hypernatremia   Acute metabolic encephalopathy   Lactic acidosis   Pressure injury of skin   Encephalopathy   Frontotemporal brain disease   Intravenous infiltration   Palliative care by specialist   Goals of care, counseling/discussion   Advance care planning   Aspiration pneumonia of left lower lobe due to regurgitated food (HCC)   1. Sepsis due to pneumonia, possible aspiration. Present on admission. Aspiration precautions, patient completed antibiotic therapy today, continue oxymetry monitoring and as needed supplemental oxygen per [place].   2. Metabolic encephalopathy. Neuro checks per unit protocol, aspiration precautions. Clinically at her baseline.  3, T2DM. Glucose cover and monitoring with insulin sliding scale, capillary glucose 86-95-94, very poor oral intake.    4. AKI. Pre-renal due to poor oral intake, will continue IV fluids while patient in the hospital, will plan to d/c parenteral IV fluids at discharge. Per family meeting, will hold on peg tube. Patient with poor prognosis and adequate for hospice care.   5. HTN. Blood pressure 115 to 120 systolic,  will continue with as needed hydralazine.   6. Hypernatremia with hypoKalemia.  Continue IV fluids, will replete K today. Will hold on further blood work for now, will focus  on patient's comfort and quality of life.   7. DVT/ anemia . Continue anticoagulation with apixaban.   8. Dysphagia due to dementia. Feedings with assistance, pureed diet. Patient has been refusing to eat. Encourage po hydration.   9. Hypocalcemia. Will follow on 25 hydroxy vitamin D. ca up to 7.7 after 1 amp of calcium gluconate. Will hold on further blood work for now.    DVT prophylaxis: apixaban.  Code Status: DNR  Family Communication: yesterday I spoke with patient's sister at length about patient's condition and plan of care.  Disposition Plan: SNF with hospice    Consultants:   Palliative care  Procedures:     Antimicrobials:  Levofloxacin dc on 11/12.   Subjective: Patient not verbal, no report of nausea or vomiting, but very poor oral intake.   Objective: Vitals:   12/23/15 0431 12/23/15 1505 12/24/15 0441 12/24/15 1334  BP: 123/74 111/72 121/69 115/73  Pulse: 81 96 88 86  Resp: 18 18 19 18   Temp: 98.2 F (36.8 C) 99.1 F (37.3 C) 99.2 F (37.3 C) 99.5 F (37.5 C)  TempSrc: Axillary Oral Oral Oral  SpO2: 98% 98% 98% 98%  Weight: 83.9 kg (184 lb 15.5 oz)     Height:        Intake/Output Summary (Last 24 hours) at 12/24/15 1505 Last data filed at 12/24/15 1335  Gross per 24 hour  Intake             1210 ml  Output             1001 ml  Net  209 ml   Filed Weights   12/21/15 0413 12/22/15 0438 12/23/15 0431  Weight: 83 kg (182 lb 15.7 oz) 82.7 kg (182 lb 5.1 oz) 83.9 kg (184 lb 15.5 oz)    Examination:  General exam: deconditioned, not in pain or dsypnea E ENT. Mild pallor but no icterus. Oral mucosa moist. Respiratory system: . Respiratory effort normal.Decreased inspiratory effort with decreased breath sounds at bases.  Cardiovascular system: S1 & S2 heard, RRR. No JVD, murmurs, rubs, gallops or clicks. No pedal edema. Gastrointestinal system: Abdomen is nondistended, soft and nontender. No organomegaly or masses felt.  Normal bowel sounds heard. Central nervous system: Alert and oriented. No focal neurological deficits. Extremities: Symmetric 5 x 5 power. Skin: No rashes, lesions or ulcers, on anterior examination.    Data Reviewed: I have personally reviewed following labs and imaging studies  CBC:  Recent Labs Lab 12/18/15 0409 12/19/15 0705 12/20/15 0500 12/21/15 0400 12/22/15 0529  WBC 18.1* 13.9* 11.7* 7.7 8.7  NEUTROABS  --   --   --   --  4.9  HGB 11.2* 11.4* 9.7* 9.6* 9.8*  HCT 36.4 36.1 30.2* 29.5* 30.1*  MCV 100.3* 100.0 97.7 97.0 97.1  PLT 181 173 178 163 192   Basic Metabolic Panel:  Recent Labs Lab 12/19/15 0705 12/20/15 0500 12/21/15 0400 12/22/15 0529 12/23/15 1340  NA 148* 141 136 140 139  K 4.6 3.3* 5.0 4.2 3.1*  CL 120* 113* 108 117* 111  CO2 22 22 24  18* 22  GLUCOSE 154* 157* 306* 200* 104*  BUN 8 5* <5* <5* <5*  CREATININE 0.78 0.62 0.61 0.47 0.53  CALCIUM 8.0* 7.5* 7.4* 6.1* 7.7*   GFR: Estimated Creatinine Clearance: 80.1 mL/min (by C-G formula based on SCr of 0.53 mg/dL). Liver Function Tests: No results for input(s): AST, ALT, ALKPHOS, BILITOT, PROT, ALBUMIN in the last 168 hours. No results for input(s): LIPASE, AMYLASE in the last 168 hours. No results for input(s): AMMONIA in the last 168 hours. Coagulation Profile: No results for input(s): INR, PROTIME in the last 168 hours. Cardiac Enzymes: No results for input(s): CKTOTAL, CKMB, CKMBINDEX, TROPONINI in the last 168 hours. BNP (last 3 results) No results for input(s): PROBNP in the last 8760 hours. HbA1C: No results for input(s): HGBA1C in the last 72 hours. CBG:  Recent Labs Lab 12/23/15 2004 12/23/15 2358 12/24/15 0443 12/24/15 0801 12/24/15 1137  GLUCAP 87 77 86 95 94   Lipid Profile: No results for input(s): CHOL, HDL, LDLCALC, TRIG, CHOLHDL, LDLDIRECT in the last 72 hours. Thyroid Function Tests: No results for input(s): TSH, T4TOTAL, FREET4, T3FREE, THYROIDAB in the last 72  hours. Anemia Panel:  Recent Labs  12/22/15 0529  FERRITIN 683*  TIBC 133*  IRON 44   Sepsis Labs: No results for input(s): PROCALCITON, LATICACIDVEN in the last 168 hours.  Recent Results (from the past 240 hour(s))  Blood Culture (routine x 2)     Status: None   Collection Time: 12/15/15  8:32 PM  Result Value Ref Range Status   Specimen Description BLOOD LEFT ANTECUBITAL  Final   Special Requests BOTTLES DRAWN AEROBIC AND ANAEROBIC 5CC  Final   Culture NO GROWTH 5 DAYS  Final   Report Status 12/20/2015 FINAL  Final  Blood Culture (routine x 2)     Status: None   Collection Time: 12/15/15  9:10 PM  Result Value Ref Range Status   Specimen Description BLOOD RIGHT HAND  Final   Special Requests IN PEDIATRIC  BOTTLE 4CC  Final   Culture NO GROWTH 5 DAYS  Final   Report Status 12/20/2015 FINAL  Final  Urine culture     Status: None   Collection Time: 12/15/15 10:16 PM  Result Value Ref Range Status   Specimen Description URINE, CATHETERIZED  Final   Special Requests NONE  Final   Culture NO GROWTH  Final   Report Status 12/17/2015 FINAL  Final  MRSA PCR Screening     Status: None   Collection Time: 12/16/15  6:30 AM  Result Value Ref Range Status   MRSA by PCR NEGATIVE NEGATIVE Final    Comment:        The GeneXpert MRSA Assay (FDA approved for NASAL specimens only), is one component of a comprehensive MRSA colonization surveillance program. It is not intended to diagnose MRSA infection nor to guide or monitor treatment for MRSA infections.          Radiology Studies: No results found.      Scheduled Meds: . apixaban  2.5 mg Oral BID  . aspirin  150 mg Rectal Daily  . chlorhexidine  15 mL Mouth Rinse BID  . famotidine (PEPCID) IV  20 mg Intravenous Q24H  . insulin aspart  0-15 Units Subcutaneous Q4H  . mouth rinse  15 mL Mouth Rinse q12n4p  . potassium chloride (KCL MULTIRUN) 30 mEq in 265 mL IVPB  30 mEq Intravenous Once  . sodium chloride flush   10-40 mL Intracatheter Q12H  . sodium chloride flush  3 mL Intravenous Q12H   Continuous Infusions: . sodium chloride 75 mL/hr at 12/24/15 8119     LOS: 9 days       Skylie Hiott Annett Gula, MD [facility] Pager [phone]  If 7PM-7AM, please contact night-coverage [email or web address] Password [email or web address] 12/24/2015, 3:05 PM

## 2015-12-25 LAB — GLUCOSE, CAPILLARY
GLUCOSE-CAPILLARY: 89 mg/dL (ref 65–99)
GLUCOSE-CAPILLARY: 72 mg/dL (ref 65–99)
Glucose-Capillary: 74 mg/dL (ref 65–99)
Glucose-Capillary: 79 mg/dL (ref 65–99)
Glucose-Capillary: 82 mg/dL (ref 65–99)
Glucose-Capillary: 92 mg/dL (ref 65–99)

## 2015-12-25 LAB — VITAMIN D 25 HYDROXY (VIT D DEFICIENCY, FRACTURES): VIT D 25 HYDROXY: 7.6 ng/mL — AB (ref 30.0–100.0)

## 2015-12-25 NOTE — Clinical Social Work Note (Signed)
Patient will discharge today per MD order. [facility] prior to transportation to: [phone] Transportation: [facility] to be arranged at 4:30pm per sister/HCPOA request  CSW sent discharge summary to SNF for review.    Vickii PennaGina Rhya Shan, MSW, LCSW  [phone]  Licensed Clinical Social Worker

## 2015-12-25 NOTE — Progress Notes (Addendum)
PROGRESS NOTE    Janet Griffin  ZOX:[id number] DOB: 03/16/1950 DOA: 12/15/2015 PCP: Keane Police, MD    Brief Narrative:  65 yo female, non verbal and non ambulatory from the nursing, home. Admitted with sepsis, suspected aspiration pneumonia. Tolerating well medical therapy, aspiration precautions. Discharge held due to poor oral intake. Plan for discharge to snf with hospice. Per family meeting, with patient's son and sister, plan to continue hospice, no feeding tube.    Assessment & Plan:   Principal Problem:   Acute kidney injury (HCC) Active Problems:   Essential hypertension   History of DVT of lower extremity   Sepsis (HCC)   Hypernatremia   Acute metabolic encephalopathy   Lactic acidosis   Pressure injury of skin   Encephalopathy   Frontotemporal brain disease   Intravenous infiltration   Palliative care by specialist   Goals of care, counseling/discussion   Advance care planning   Aspiration pneumonia of left lower lobe due to regurgitated food [facility])     Patient has remained stable over last 24 hours, No signs of pain or discomfort. Patient will be transported to the skilled nursing facility with hospice services. Patient needs to be turned every 2 hours to avoid worsening pressure ulcers. Continue local wound  care, encourage  by mouth intake.   I spoke with patient's sister, caregiver, over the phone, all questions were addressed. Patient clinically with no acute decompensation, will continue her care focusing on patient's quality of  life and not suffering, as discussed in the family meeting which took place yesterday with the presence of patient's son.    DVT prophylaxis:apixaban.  Code Status:DNR  Family Communication:yesterday I spoke with patient's sister at length about patient's condition and plan of care.  Disposition Plan:SNF with hospice    Consultants:  Palliative care  Procedures:    Antimicrobials:  Levofloxacin dc  on 11/12.    Subjective: Patient has remained stable over last 24 hours, no signs of pain or dyspnea.   Objective: Vitals:   12/23/15 1505 12/24/15 0441 12/24/15 1334 12/25/15 0410  BP: 111/72 121/69 115/73 117/83  Pulse: 96 88 86 83  Resp: 18 19 18 19   Temp: 99.1 F (37.3 C) 99.2 F (37.3 C) 99.5 F (37.5 C) 98.4 F (36.9 C)  TempSrc: Oral Oral Oral Oral  SpO2: 98% 98% 98% 98%  Weight:      Height:        Intake/Output Summary (Last 24 hours) at 12/25/15 1235 Last data filed at 12/25/15 0900  Gross per 24 hour  Intake              945 ml  Output             1026 ml  Net              -81 ml   Filed Weights   12/21/15 0413 12/22/15 0438 12/23/15 0431  Weight: 83 kg (182 lb 15.7 oz) 82.7 kg (182 lb 5.1 oz) 83.9 kg (184 lb 15.5 oz)    Examination:  General exam: not in pain or dyspnea E ENT: no pallor or icterus Respiratory system: Clear to auscultation. Respiratory effort normal. No wheezing, rales or rhonchi. Cardiovascular system: S1 & S2 heard, RRR. No JVD, murmurs, rubs, gallops or clicks. Left lower extremity edema.  Gastrointestinal system: Abdomen is nondistended, soft and nontender. No organomegaly or masses felt. Normal bowel sounds heard. Central nervous system: Alert and oriented. No focal neurological deficits. Extremities: Symmetric 5 x  5 power. Skin:  Right leg with ulcerated lesion stage II  anterior aspect from proximal leg to foot. Sacrum with 2 cm long ucer stage 2, no erythema or drainage.     Data Reviewed: I have personally reviewed following labs and imaging studies  CBC:  Recent Labs Lab 12/19/15 0705 12/20/15 0500 12/21/15 0400 12/22/15 0529  WBC 13.9* 11.7* 7.7 8.7  NEUTROABS  --   --   --  4.9  HGB 11.4* 9.7* 9.6* 9.8*  HCT 36.1 30.2* 29.5* 30.1*  MCV 100.0 97.7 97.0 97.1  PLT 173 178 163 192   Basic Metabolic Panel:  Recent Labs Lab 12/19/15 0705 12/20/15 0500 12/21/15 0400 12/22/15 0529 12/23/15 1340  NA 148* 141 136  140 139  K 4.6 3.3* 5.0 4.2 3.1*  CL 120* 113* 108 117* 111  CO2 22 22 24  18* 22  GLUCOSE 154* 157* 306* 200* 104*  BUN 8 5* <5* <5* <5*  CREATININE 0.78 0.62 0.61 0.47 0.53  CALCIUM 8.0* 7.5* 7.4* 6.1* 7.7*   GFR: Estimated Creatinine Clearance: 80.1 mL/min (by C-G formula based on SCr of 0.53 mg/dL). Liver Function Tests: No results for input(s): AST, ALT, ALKPHOS, BILITOT, PROT, ALBUMIN in the last 168 hours. No results for input(s): LIPASE, AMYLASE in the last 168 hours. No results for input(s): AMMONIA in the last 168 hours. Coagulation Profile: No results for input(s): INR, PROTIME in the last 168 hours. Cardiac Enzymes: No results for input(s): CKTOTAL, CKMB, CKMBINDEX, TROPONINI in the last 168 hours. BNP (last 3 results) No results for input(s): PROBNP in the last 8760 hours. HbA1C: No results for input(s): HGBA1C in the last 72 hours. CBG:  Recent Labs Lab 12/24/15 2024 12/25/15 0001 12/25/15 0344 12/25/15 0410 12/25/15 0753  GLUCAP 71 92 79 82 89   Lipid Profile: No results for input(s): CHOL, HDL, LDLCALC, TRIG, CHOLHDL, LDLDIRECT in the last 72 hours. Thyroid Function Tests: No results for input(s): TSH, T4TOTAL, FREET4, T3FREE, THYROIDAB in the last 72 hours. Anemia Panel: No results for input(s): VITAMINB12, FOLATE, FERRITIN, TIBC, IRON, RETICCTPCT in the last 72 hours. Sepsis Labs: No results for input(s): PROCALCITON, LATICACIDVEN in the last 168 hours.  Recent Results (from the past 240 hour(s))  Blood Culture (routine x 2)     Status: None   Collection Time: 12/15/15  8:32 PM  Result Value Ref Range Status   Specimen Description BLOOD LEFT ANTECUBITAL  Final   Special Requests BOTTLES DRAWN AEROBIC AND ANAEROBIC 5CC  Final   Culture NO GROWTH 5 DAYS  Final   Report Status 12/20/2015 FINAL  Final  Blood Culture (routine x 2)     Status: None   Collection Time: 12/15/15  9:10 PM  Result Value Ref Range Status   Specimen Description BLOOD RIGHT  HAND  Final   Special Requests IN PEDIATRIC BOTTLE 4CC  Final   Culture NO GROWTH 5 DAYS  Final   Report Status 12/20/2015 FINAL  Final  Urine culture     Status: None   Collection Time: 12/15/15 10:16 PM  Result Value Ref Range Status   Specimen Description URINE, CATHETERIZED  Final   Special Requests NONE  Final   Culture NO GROWTH  Final   Report Status 12/17/2015 FINAL  Final  MRSA PCR Screening     Status: None   Collection Time: 12/16/15  6:30 AM  Result Value Ref Range Status   MRSA by PCR NEGATIVE NEGATIVE Final    Comment:  The GeneXpert MRSA Assay (FDA approved for NASAL specimens only), is one component of a comprehensive MRSA colonization surveillance program. It is not intended to diagnose MRSA infection nor to guide or monitor treatment for MRSA infections.          Radiology Studies: No results found.      Scheduled Meds: . apixaban  2.5 mg Oral BID  . aspirin  150 mg Rectal Daily  . chlorhexidine  15 mL Mouth Rinse BID  . famotidine (PEPCID) IV  20 mg Intravenous Q24H  . insulin aspart  0-15 Units Subcutaneous Q4H  . mouth rinse  15 mL Mouth Rinse q12n4p  . sodium chloride flush  10-40 mL Intracatheter Q12H  . sodium chloride flush  3 mL Intravenous Q12H   Continuous Infusions: . sodium chloride 75 mL/hr at 12/25/15 0041     LOS: 10 days        Mauricio Annett Gulaaniel Arrien, MD [facility] Pager [phone]  If 7PM-7AM, please contact night-coverage [email or web address] Password [email or web address] 12/25/2015, 12:35 PM

## 2015-12-25 NOTE — Progress Notes (Signed)
[facility] Waiting for [facility]tar to pick up pt. Foley and picc line discontinued as ordered

## 2015-12-25 NOTE — Progress Notes (Signed)
Attempted to call report twice to [facility], left a voice message,no call back received yet.

## 2015-12-25 NOTE — Progress Notes (Signed)
Pt still waiting for ptar to come pick up. No time estimate given but they said she is on their list to list up. Family member also reports that pt had a grey blanket but no personal blanket seen on pt this morning

## 2015-12-27 ENCOUNTER — Other Ambulatory Visit: Payer: Self-pay | Admitting: *Deleted

## 2015-12-27 ENCOUNTER — Non-Acute Institutional Stay (SKILLED_NURSING_FACILITY): Payer: Medicare Other | Admitting: Internal Medicine

## 2015-12-27 ENCOUNTER — Encounter: Payer: Self-pay | Admitting: Internal Medicine

## 2015-12-27 DIAGNOSIS — D638 Anemia in other chronic diseases classified elsewhere: Secondary | ICD-10-CM | POA: Diagnosis not present

## 2015-12-27 DIAGNOSIS — G4709 Other insomnia: Secondary | ICD-10-CM

## 2015-12-27 DIAGNOSIS — L89620 Pressure ulcer of left heel, unstageable: Secondary | ICD-10-CM

## 2015-12-27 DIAGNOSIS — J690 Pneumonitis due to inhalation of food and vomit: Secondary | ICD-10-CM

## 2015-12-27 DIAGNOSIS — R633 Feeding difficulties, unspecified: Secondary | ICD-10-CM

## 2015-12-27 DIAGNOSIS — I10 Essential (primary) hypertension: Secondary | ICD-10-CM | POA: Diagnosis not present

## 2015-12-27 DIAGNOSIS — K5909 Other constipation: Secondary | ICD-10-CM

## 2015-12-27 DIAGNOSIS — F0280 Dementia in other diseases classified elsewhere without behavioral disturbance: Secondary | ICD-10-CM

## 2015-12-27 DIAGNOSIS — Z86718 Personal history of other venous thrombosis and embolism: Secondary | ICD-10-CM

## 2015-12-27 DIAGNOSIS — R5381 Other malaise: Secondary | ICD-10-CM

## 2015-12-27 DIAGNOSIS — E876 Hypokalemia: Secondary | ICD-10-CM

## 2015-12-27 DIAGNOSIS — G300 Alzheimer's disease with early onset: Secondary | ICD-10-CM | POA: Diagnosis not present

## 2015-12-27 DIAGNOSIS — L89152 Pressure ulcer of sacral region, stage 2: Secondary | ICD-10-CM

## 2015-12-27 DIAGNOSIS — R1312 Dysphagia, oropharyngeal phase: Secondary | ICD-10-CM

## 2015-12-27 DIAGNOSIS — K219 Gastro-esophageal reflux disease without esophagitis: Secondary | ICD-10-CM | POA: Diagnosis not present

## 2015-12-27 MED ORDER — AMBULATORY NON FORMULARY MEDICATION: 0 refills | Status: DC

## 2015-12-27 NOTE — Progress Notes (Signed)
[facility]  PCP: Keane PoliceSLADE-HARTMAN, [name], MD   Code Status: DNR  Goals of care: Advanced Directive information Advanced Directives 12/27/2015  Does patient have an advance directive? Yes  Type of Advance Directive Out of facility DNR (pink MOST or yellow form)  Does patient want to make changes to advanced directive? No - Patient declined  Copy of advanced directive(s) in chart? Yes       Extended Emergency Contact Information Primary Emergency Contact: [facility]Trina Address: [address]          [place], [place] [address] [place] Home Phone: [phone] Work Phone: [phone] Relation: Sister Secondary Emergency Contact: Tabet,James Address: [address] [place] [place]          [place], [place] [address] [place] Home Phone: [phone] Relation: Spouse   Allergies  Allergen Reactions  . Penicillins Hives and Anaphylaxis    Chief Complaint  Patient presents with  . Readmit To SNF    Readmission Visit     HPI:  Patient is a 65 y.o. female seen today for Long-term care post hospital admission from 12/15/2015-12/25/2015 with altered mentation, increased lethargy and poor oral intake. She was found to have sepsis with healthcare associated pneumonia versus aspiration pneumonia. She was started on broad-spectrum antibiotics and IV fluids. Cultures were obtained and had no growth until the time of discharge. She was later switched to oral antibiotic. She was seen by speech therapist and pured/honey thick liquid diet was recommended with aspiration precautions. She was treated with IV fluids for acute kidney injury and electrolyte imbalance. CT of the abdomen showed no obstructive uropathy. CT head was negative for acute intracranial abnormalities. She has medical history of CVA with right-sided weakness, advanced dementia, type 2 diabetes mellitus, anemia of chronic disease and DVT among others. She is seen in her room  today.  Review of Systems: Unable to obtain. Per nursing no fever reported. No falls reported. She is under total care and is non verbal and does not follow commands.     Past Medical History:  Diagnosis Date  . Dementia    Past Surgical History:  Procedure Laterality Date  . CHOLECYSTECTOMY     Social History:   reports that she has never smoked. She has never used smokeless tobacco. She reports that she does not drink alcohol. Her drug history is not on file.  No family history on file.  Medications:   Medication List       Accurate as of 12/27/15 12:22 PM. Always use your most recent med list.          acetaminophen 325 MG tablet Commonly known as:  TYLENOL Take 2 tablets (650 mg total) by mouth every 8 (eight) hours as needed for moderate pain or fever (or Fever >/= 101).   albuterol (2.5 MG/3ML) 0.083% nebulizer solution Commonly known as:  PROVENTIL Take 2.5 mg by nebulization every 6 (six) hours as needed for wheezing or shortness of breath.   amLODipine 5 MG tablet Commonly known as:  NORVASC Take 5 mg by mouth daily.   ARTIFICIAL TEARS OP Apply 1 drop to eye 4 (four) times daily as needed.   bisacodyl 10 MG suppository Commonly known as:  DULCOLAX Place 10 mg rectally daily as needed for moderate constipation.   capsaicin 0.025 % cream Commonly known as:  ZOSTRIX Apply 1 application topically 3 (three) times daily. Apply thin layer to bilateral knees   ELIQUIS 2.5 MG Tabs tablet Generic  drug:  apixaban Take 2.5 mg by mouth 2 (two) times daily.   hydrochlorothiazide 12.5 MG capsule Commonly known as:  MICROZIDE Take 12.5 mg by mouth daily.   levofloxacin 500 MG tablet Commonly known as:  LEVAQUIN Take 1 tablet (500 mg total) by mouth daily.   Melatonin 3 MG Tabs Take 1 tablet by mouth at bedtime.   memantine 10 MG tablet Commonly known as:  NAMENDA Take 30 mg by mouth 2 (two) times daily.   morphine CONCENTRATE 10 MG/0.5ML Soln  concentrated solution Place 0.25 mLs (5 mg total) under the tongue every 2 (two) hours as needed for moderate pain, severe pain, anxiety or shortness of breath.   polyethylene glycol packet Commonly known as:  MIRALAX / GLYCOLAX Take 17 g by mouth at bedtime.   ranitidine 75 MG tablet Commonly known as:  ZANTAC Take 75 mg by mouth at bedtime.   senna 8.6 MG tablet Commonly known as:  SENOKOT Take 1 tablet by mouth at bedtime as needed for constipation.   traZODone 100 MG tablet Commonly known as:  DESYREL Take 100 mg by mouth at bedtime.       Immunizations: Immunization History  Administered Date(s) Administered  . Influenza-Unspecified 11/16/2015  . PPD Test 10/28/2015     Physical Exam: Vitals:   12/27/15 1215  BP: 132/88  Pulse: 100  Resp: 20  Temp: 97.8 F (36.6 C)  TempSrc: Oral  SpO2: 99%  Weight: 165 lb (74.8 kg)  Height: 5\' 7"  (1.702 m)   Body mass index is 25.84 kg/m.  General- adult female, frail, chronically ill appearing, in no acute distress, non verbal Head- normocephalic, atraumatic Nose- no nasal discharge Throat- moist mucus membrane, grinds her teeth, refuses/ resists attempts to open her mouth Eyes- PERRLA, EOMI, no pallor, no icterus Neck- no cervical lymphadenopathy Cardiovascular- normal s1,s2, no murmur, no leg edema Respiratory- decreased air entry, no wheeze, no rhonchi, no crackles, no use of accessory muscles Abdomen- bowel sounds present, soft, non tender Musculoskeletal- in bed all the time, under total care with her ADLs, PROM to all extremities with spasticity noted and contracture to right arm. Neurological- non verbal, does not follow command Skin- warm and dry, heel floaters to both heels, has dressing to both her legs, bruising to both arms, unstageable left heel pressure ulcer, RLE full thickness wound present, stage 2 pressure ulcer to coccyx   Labs reviewed: Basic Metabolic Panel:  Recent Labs  16/10/96 0852   12/21/15 0400 12/22/15 0529 12/23/15 1340  NA  --   < > 136 140 139  K  --   < > 5.0 4.2 3.1*  CL  --   < > 108 117* 111  CO2  --   < > 24 18* 22  GLUCOSE  --   < > 306* 200* 104*  BUN  --   < > <5* <5* <5*  CREATININE  --   < > 0.61 0.47 0.53  CALCIUM  --   < > 7.4* 6.1* 7.7*  MG 2.2  --   --   --   --   < > = values in this interval not displayed. Liver Function Tests:  Recent Labs  11/02/15 12/15/15 2032 12/16/15 0030  AST 26 112* 94*  ALT 42* 156* 127*  ALKPHOS 60 77 62  BILITOT  --  3.8* 2.9*  PROT  --  7.8 6.9  ALBUMIN  --  3.9 3.5   No results for input(s): LIPASE, AMYLASE  in the last 8760 hours. No results for input(s): AMMONIA in the last 8760 hours. CBC:  Recent Labs  12/15/15 2032  12/20/15 0500 12/21/15 0400 12/22/15 0529  WBC 16.2*  < > 11.7* 7.7 8.7  NEUTROABS 14.2*  --   --   --  4.9  HGB 17.8*  < > 9.7* 9.6* 9.8*  HCT 55.5*  < > 30.2* 29.5* 30.1*  MCV 101.1*  < > 97.7 97.0 97.1  PLT 284  < > 178 163 192  < > = values in this interval not displayed. Cardiac Enzymes:  Recent Labs  12/16/15 0328 12/16/15 0516 12/16/15 0728  TROPONINI 1.28* 0.97* 0.94*   BNP: Invalid input(s): POCBNP CBG:  Recent Labs  12/25/15 0753 12/25/15 1315 12/25/15 1715  GLUCAP 89 72 74    Radiological Exams: Dg Chest 2 View  Result Date: 12/17/2015 CLINICAL DATA:  Generalized weakness EXAM: CHEST  2 VIEW COMPARISON:  12/15/2015 FINDINGS: Cardiac shadow is stable. The lungs are well aerated bilaterally. No focal infiltrate or sizable effusion is seen. No acute bony abnormality is noted. IMPRESSION: No active cardiopulmonary disease. Electronically Signed   By: Alcide CleverMark  Lukens M.D.   On: 12/17/2015 14:29   Ct Head Wo Contrast  Result Date: 12/16/2015 CLINICAL DATA:  Encephalopathy and dementia EXAM: CT HEAD WITHOUT CONTRAST TECHNIQUE: Contiguous axial images were obtained from the base of the skull through the vertex without intravenous contrast. COMPARISON:  Head  CT 05/15/2014 FINDINGS: Brain: There is advanced, diffuse supratentorial volume loss with associated ex vacuo dilatation of the lateral and third ventricles. There is no acute intracranial hemorrhage. No CT evidence of acute cortical infarct. No midline shift or mass lesion. There is periventricular hypoattenuation compatible with chronic microvascular disease. Vascular: No hyperdense vessel or unexpected calcification. Skull: Normal. Negative for fracture or focal lesion. Sinuses/Orbits: The visualized portions of the paranasal sinuses and mastoid air cells are free of fluid. No advanced mucosal thickening. The visualized orbits are normal. Other: None. IMPRESSION: 1. Advanced atrophy with findings of chronic microvascular disease. 2. No acute intracranial abnormality. Electronically Signed   By: Deatra RobinsonKevin  Herman M.D.   On: 12/16/2015 01:25   Dg Chest Port 1 View  Result Date: 12/15/2015 CLINICAL DATA:  Sepsis EXAM: PORTABLE CHEST 1 VIEW COMPARISON:  09/08/2012 FINDINGS: Small amount of left basilar atelectasis versus mild medial infiltrate. No effusion. Heart size normal. No pneumothorax. IMPRESSION: Mild medial left base opacity could relate to mild atelectasis versus small infiltrate. Electronically Signed   By: Jasmine PangKim  Fujinaga M.D.   On: 12/15/2015 21:06   Ct Renal Stone Study  Result Date: 12/16/2015 CLINICAL DATA:  Acute kidney injury. Concern for sepsis and urinary tract infection. EXAM: CT ABDOMEN AND PELVIS WITHOUT CONTRAST TECHNIQUE: Multidetector CT imaging of the abdomen and pelvis was performed following the standard protocol without IV contrast. COMPARISON:  Pelvic radiograph 09/08/2012 FINDINGS: Lower chest: Respiratory motion somewhat degrades evaluation of the lung bases. There is consolidation versus atelectasis in the left lung base. Milder right basilar atelectasis. No pleural effusion. Hepatobiliary: Normal hepatic size and contours. No perihepatic ascites. No intra- or extrahepatic biliary  dilatation. The gallbladder is surgically absent. Pancreas: Normal pancreatic contours. No peripancreatic fluid collection or pancreatic ductal dilatation. Spleen: Small but otherwise normal. Adrenals/Urinary Tract: Normal adrenal glands. No hydronephrosis or nephrolithiasis. No abnormal perinephric stranding. No ureteral obstruction. Stomach/Bowel: No abnormal bowel dilatation. No bowel wall thickening or adjacent fat stranding to indicate acute inflammation. No abdominal fluid collection. Normal appendix. Vascular/Lymphatic: Atherosclerotic  calcification of the non aneurysmal abdominal aorta. No abdominal or pelvic adenopathy. Reproductive: Hyperdense lesion at the left uterine fundus measures 3.5 cm. Ovaries are not clearly identified. No free fluid in the pelvis. Musculoskeletal: Right greater than left hip osteoarthrosis. Lower lumbar facet arthrosis and osteophytosis. Small amounts of right lower quadrant subcutaneous gas. Question recent subcutaneous injection (such as Lovenox or insulin). Other: No contributory non-categorized findings. IMPRESSION: 1. No obstructive uropathy or perinephric stranding. Assessment for pyelonephritis is limited in the absence of IV contrast. 2. Consolidative opacity in the left lower lobe, infection versus atelectasis. 3. Subcutaneous gas in the right lower quadrant is likely iatrogenic. Correlate for recent subcutaneous injection. 4. Aortic atherosclerosis. Electronically Signed   By: Deatra Robinson M.D.   On: 12/16/2015 01:10    Assessment/Plan  Physical deconditioning With generalized weakness. Has poor overall prognosis with her advanced dementia. Had a meeting with her sister and son regarding goals of care on 12/25/15 and plan was to get hospice and palliative care of [facility] consult for further evaluation and management. Patient to receive total care for now and to be evaluated by hospice team.   aspiration pneumonia Continue and complete the course of Levaquin  on 12/28/2015. Aspiration precautions to be taken. Full assistance to be provided with each meals. Continue albuterol inhaler on an as-needed basis for dyspnea/wheezing. Continue morphine sublingual as needed for dyspnea.  Dysphagia Continue pured diet and honey thick liquids. Full assistance with meals. Aspiration precautions.  Stage II pressure ulcer coccyx Wound Care team on board. Pressure ulcer prophylaxis and wound care to be provided.   Left heel unstageable pressure ulcer Wound care to be provided per facility protocol. Continue with her floaters.  Hypokalemia Monitor bmp  Poor oral intake Decline anticipated, encourage po intake as tolerated. Full assist with meals. Comfort care. To provide oral care.   Anemia of chronic disease Monitor cbc  History of DVT Continued eliquis 2.5 mg twice a day for now.  Alzheimer's dementia Advanced. Continue Namenda 10 mg twice a day. Decline anticipated  Chronic constipation Continue senna daily as needed and continue MiraLAX on a daily basis. Maintain hydration.  Esophageal reflux disease Continue Zantac 75 mg daily at bedtime.  Insomnia Stable on current regimen of trazodone and melatonin and monitor.  Hypertension Continue Norvasc 5 mg daily and Microzide 12.5 mg daily. Monitor blood pressure readings every shift for now.    Goals of care: long term care with comfort measures   Labs/tests ordered: cbc, cmp 01/01/16  Family/ staff Communication: reviewed care plan with patient's nursing supervisor    Oneal Grout, MD Internal Medicine [facility] [address] [place], [place] [address] Cell Phone (Monday-Friday 8 am - 5 pm): [phone] On Call: [phone] and follow prompts after 5 pm and on weekends Office Phone: [phone] Office Fax: [phone]

## 2015-12-27 NOTE — Telephone Encounter (Signed)
[facility] [phone] Fax: [phone]  

## 2016-01-08 ENCOUNTER — Non-Acute Institutional Stay (SKILLED_NURSING_FACILITY): Payer: Medicare Other | Admitting: Family

## 2016-01-08 DIAGNOSIS — L89620 Pressure ulcer of left heel, unstageable: Secondary | ICD-10-CM | POA: Diagnosis not present

## 2016-01-08 DIAGNOSIS — M79604 Pain in right leg: Secondary | ICD-10-CM | POA: Diagnosis not present

## 2016-01-08 DIAGNOSIS — L89890 Pressure ulcer of other site, unstageable: Secondary | ICD-10-CM | POA: Diagnosis not present

## 2016-01-08 DIAGNOSIS — L98429 Non-pressure chronic ulcer of back with unspecified severity: Secondary | ICD-10-CM

## 2016-01-08 DIAGNOSIS — L89152 Pressure ulcer of sacral region, stage 2: Secondary | ICD-10-CM

## 2016-01-08 MED ORDER — MORPHINE SULFATE (CONCENTRATE) 10 MG/0.5ML PO SOLN: 5.0000 mg | Freq: Four times a day (QID) | ORAL | 0 refills | Status: AC

## 2016-01-08 NOTE — Progress Notes (Signed)
Location:  [facility] Room Number: 807 P  Place of Service:  SNF [phone]) Provider: Dinah Ngetich FNP-C    Keane Police, MD  Patient Care Team: Keane Police, MD as PCP - General (Family Medicine)  Extended Emergency Contact Information Primary Emergency Contact: [facility] Address: [address]          [place], [place] [address] [place] Home Phone: [phone] Work Phone: [phone] Relation: Sister Secondary Emergency Contact: Cretella,James Address: [address] [place], [place] [address] [place] Home Phone: [phone] Relation: Spouse  Code Status: DNR  Goals of care: Advanced Directive information Advanced Directives 12/27/2015  Does Patient Have a Medical Advance Directive? Yes  Type of Advance Directive Out of facility DNR (pink MOST or yellow form)  Does patient want to make changes to medical advance directive? No - Patient declined  Copy of Healthcare Power of Attorney in Chart? Yes     Chief Complaint  Patient presents with  . Acute Visit    Pain     HPI:  Pt is a 65 y.o. female seen today at [facility] for an acute visit for evaluation of right leg pain. She has a medical history of HTN, Encephalopathy, Frontotemporal  brain disease among others. She is seen in her room today.She is nonverbal during the visit unable to provide HPI and ROS.Facility Nurse reports patient's POA request patient's pain medication scheduled current pain regimen not effective with pain. Patient moaning and groaning whenever right leg is touched.wound nurse continue to manage right leg wound, left heel and sacral wound.    Past Medical History:  Diagnosis Date  . Dementia    Past Surgical History:  Procedure Laterality Date  . CHOLECYSTECTOMY      Allergies  Allergen Reactions  . Penicillins Hives and Anaphylaxis      Medication List         Accurate as of 01/08/16  5:15 PM. Always use your most recent med list.          acetaminophen 325 MG tablet Commonly known as:  TYLENOL Take 2 tablets (650 mg total) by mouth every 8 (eight) hours as needed for moderate pain or fever (or Fever >/= 101).   albuterol (2.5 MG/3ML) 0.083% nebulizer solution Commonly known as:  PROVENTIL Take 2.5 mg by nebulization every 6 (six) hours as needed for wheezing or shortness of breath.   amLODipine 5 MG tablet Commonly known as:  NORVASC Take 5 mg by mouth daily.   ARTIFICIAL TEARS OP Apply 1 drop to eye 4 (four) times daily as needed.   bisacodyl 10 MG suppository Commonly known as:  DULCOLAX Place 10 mg rectally daily as needed for moderate constipation.   capsaicin 0.025 % cream Commonly known as:  ZOSTRIX Apply 1 application topically 3 (three) times daily. Apply thin layer to bilateral knees   ELIQUIS 2.5 MG Tabs tablet Generic drug:  apixaban Take 2.5 mg by mouth 2 (two) times daily.   hydrochlorothiazide 12.5 MG capsule Commonly known as:  MICROZIDE Take 12.5 mg by mouth daily.   levofloxacin 500 MG tablet Commonly known as:  LEVAQUIN Take 1 tablet (500 mg total) by mouth daily.   Melatonin 3 MG Tabs Take 1 tablet by mouth at bedtime.   memantine 10 MG tablet Commonly known as:  NAMENDA Take 30 mg by mouth 2 (two) times daily.  morphine CONCENTRATE 10 MG/0.5ML Soln concentrated solution Place 0.25 mLs (5 mg total) under the tongue every 2 (two) hours as needed for moderate pain, severe pain, anxiety or shortness of breath.   ranitidine 75 MG tablet Commonly known as:  ZANTAC Take 75 mg by mouth at bedtime.   senna 8.6 MG tablet Commonly known as:  SENOKOT Take 1 tablet by mouth at bedtime as needed for constipation.   traZODone 100 MG tablet Commonly known as:  DESYREL Take 100 mg by mouth at bedtime.       Review of Systems  Unable to perform ROS: Patient nonverbal    Immunization History   Administered Date(s) Administered  . Influenza-Unspecified 11/16/2015  . PPD Test 10/28/2015   Pertinent  Health Maintenance Due  Topic Date Due  . PAP SMEAR  06/03/1973  . MAMMOGRAM  06/04/2002  . COLONOSCOPY  06/04/2002  . INFLUENZA VACCINE  Completed      Vitals:   01/08/16 1200  BP: 137/71  Pulse: 68  Resp: 16  Temp: 97.9 F (36.6 C)  SpO2: 96%  Weight: 165 lb (74.8 kg)  Height: 5\' 7"  (1.702 m)   Body mass index is 25.84 kg/m. Physical Exam  Constitutional: She appears well-developed and well-nourished. No distress.  Non-verbal   HENT:  Head: Normocephalic.  Mouth/Throat: Oropharynx is clear and moist. No oropharyngeal exudate.  Eyes: Conjunctivae and EOM are normal. Pupils are equal, round, and reactive to light. Right eye exhibits no discharge. Left eye exhibits no discharge. No scleral icterus.  Neck: Neck supple. No JVD present.  Cardiovascular: Normal rate, regular rhythm, normal heart sounds and intact distal pulses.  Exam reveals no gallop and no friction rub.   No murmur heard. Pulmonary/Chest: Effort normal and breath sounds normal. No respiratory distress. She has no wheezes. She has no rales.  Abdominal: Soft. Bowel sounds are normal. She exhibits no distension. There is no tenderness. There is no rebound and no guarding.  Genitourinary:  Genitourinary Comments: Incontinent for both bowel and bladder   Musculoskeletal: She exhibits no edema, tenderness or deformity.  Moves X 4 extremities. Provolone boots in place.   Lymphadenopathy:    She has no cervical adenopathy.  Neurological: She is alert.  Nonverbal moaning and groaning at times.   Skin: Skin is warm and dry. No rash noted. No erythema. No pallor.  1. Stage 2 sacral; wound bed pink without any drainage.  2. Right lateral leg unstageable; majority of the wound cover with dark eschar no drainage or signs of infections noted.  3. Left heel unstageable;Dark eschar noted without any drainage or  signs of infections.  4. Left foot dark blister surrounding skin without signs of infections.   Psychiatric:  Yelling out loudly at times.     Labs reviewed:  Recent Labs  12/17/15 0852  12/21/15 0400 12/22/15 0529 12/23/15 1340  NA  --   < > 136 140 139  K  --   < > 5.0 4.2 3.1*  CL  --   < > 108 117* 111  CO2  --   < > 24 18* 22  GLUCOSE  --   < > 306* 200* 104*  BUN  --   < > <5* <5* <5*  CREATININE  --   < > 0.61 0.47 0.53  CALCIUM  --   < > 7.4* 6.1* 7.7*  MG 2.2  --   --   --   --   < > = values  in this interval not displayed.  Recent Labs  11/02/15 12/15/15 2032 12/16/15 0030  AST 26 112* 94*  ALT 42* 156* 127*  ALKPHOS 60 77 62  BILITOT  --  3.8* 2.9*  PROT  --  7.8 6.9  ALBUMIN  --  3.9 3.5    Recent Labs  12/15/15 2032  12/20/15 0500 12/21/15 0400 12/22/15 0529  WBC 16.2*  < > 11.7* 7.7 8.7  NEUTROABS 14.2*  --   --   --  4.9  HGB 17.8*  < > 9.7* 9.6* 9.8*  HCT 55.5*  < > 30.2* 29.5* 30.1*  MCV 101.1*  < > 97.7 97.0 97.1  PLT 284  < > 178 163 192  < > = values in this interval not displayed. Lab Results  Component Value Date   TSH 0.906 12/16/2015   Lab Results  Component Value Date   HGBA1C 5.4 12/16/2015   Lab Results  Component Value Date   CHOL 211 (A) 11/02/2015   HDL 45 11/02/2015   LDLCALC 146 11/02/2015   TRIG 101 11/02/2015    Significant Diagnostic Results in last 30 days:  Dg Chest 2 View  Result Date: 12/17/2015 CLINICAL DATA:  Generalized weakness EXAM: CHEST  2 VIEW COMPARISON:  12/15/2015 FINDINGS: Cardiac shadow is stable. The lungs are well aerated bilaterally. No focal infiltrate or sizable effusion is seen. No acute bony abnormality is noted. IMPRESSION: No active cardiopulmonary disease. Electronically Signed   By: Alcide CleverMark  Lukens M.D.   On: 12/17/2015 14:29   Ct Head Wo Contrast  Result Date: 12/16/2015 CLINICAL DATA:  Encephalopathy and dementia EXAM: CT HEAD WITHOUT CONTRAST TECHNIQUE: Contiguous axial images were  obtained from the base of the skull through the vertex without intravenous contrast. COMPARISON:  Head CT 05/15/2014 FINDINGS: Brain: There is advanced, diffuse supratentorial volume loss with associated ex vacuo dilatation of the lateral and third ventricles. There is no acute intracranial hemorrhage. No CT evidence of acute cortical infarct. No midline shift or mass lesion. There is periventricular hypoattenuation compatible with chronic microvascular disease. Vascular: No hyperdense vessel or unexpected calcification. Skull: Normal. Negative for fracture or focal lesion. Sinuses/Orbits: The visualized portions of the paranasal sinuses and mastoid air cells are free of fluid. No advanced mucosal thickening. The visualized orbits are normal. Other: None. IMPRESSION: 1. Advanced atrophy with findings of chronic microvascular disease. 2. No acute intracranial abnormality. Electronically Signed   By: Deatra RobinsonKevin  Herman M.D.   On: 12/16/2015 01:25   Dg Chest Port 1 View  Result Date: 12/15/2015 CLINICAL DATA:  Sepsis EXAM: PORTABLE CHEST 1 VIEW COMPARISON:  09/08/2012 FINDINGS: Small amount of left basilar atelectasis versus mild medial infiltrate. No effusion. Heart size normal. No pneumothorax. IMPRESSION: Mild medial left base opacity could relate to mild atelectasis versus small infiltrate. Electronically Signed   By: Jasmine PangKim  Fujinaga M.D.   On: 12/15/2015 21:06   Ct Renal Stone Study  Result Date: 12/16/2015 CLINICAL DATA:  Acute kidney injury. Concern for sepsis and urinary tract infection. EXAM: CT ABDOMEN AND PELVIS WITHOUT CONTRAST TECHNIQUE: Multidetector CT imaging of the abdomen and pelvis was performed following the standard protocol without IV contrast. COMPARISON:  Pelvic radiograph 09/08/2012 FINDINGS: Lower chest: Respiratory motion somewhat degrades evaluation of the lung bases. There is consolidation versus atelectasis in the left lung base. Milder right basilar atelectasis. No pleural effusion.  Hepatobiliary: Normal hepatic size and contours. No perihepatic ascites. No intra- or extrahepatic biliary dilatation. The gallbladder is surgically absent. Pancreas: Normal  pancreatic contours. No peripancreatic fluid collection or pancreatic ductal dilatation. Spleen: Small but otherwise normal. Adrenals/Urinary Tract: Normal adrenal glands. No hydronephrosis or nephrolithiasis. No abnormal perinephric stranding. No ureteral obstruction. Stomach/Bowel: No abnormal bowel dilatation. No bowel wall thickening or adjacent fat stranding to indicate acute inflammation. No abdominal fluid collection. Normal appendix. Vascular/Lymphatic: Atherosclerotic calcification of the non aneurysmal abdominal aorta. No abdominal or pelvic adenopathy. Reproductive: Hyperdense lesion at the left uterine fundus measures 3.5 cm. Ovaries are not clearly identified. No free fluid in the pelvis. Musculoskeletal: Right greater than left hip osteoarthrosis. Lower lumbar facet arthrosis and osteophytosis. Small amounts of right lower quadrant subcutaneous gas. Question recent subcutaneous injection (such as Lovenox or insulin). Other: No contributory non-categorized findings. IMPRESSION: 1. No obstructive uropathy or perinephric stranding. Assessment for pyelonephritis is limited in the absence of IV contrast. 2. Consolidative opacity in the left lower lobe, infection versus atelectasis. 3. Subcutaneous gas in the right lower quadrant is likely iatrogenic. Correlate for recent subcutaneous injection. 4. Aortic atherosclerosis. Electronically Signed   By: Deatra RobinsonKevin  Herman M.D.   On: 12/16/2015 01:10    Assessment/Plan 1. Pain in right leg Will discontinue previous Morphine sulfate orders then start Morphine 10 mg/ 5 mls place 0.25 mls ( 5 mg ) under the tongue every 6 hours for pain. Hold for sedation.   2. Unstageable pressure ulcer of left heel (HCC) Continue with wound care.   3. Pressure ulcer of right leg, unstageable  (HCC) Afebrile. No signs of infections noted.Continue with wound care. Change pain Morphine as above.   4. Stage 2 skin ulcer of sacral region Wound bed pink without any signs of infections. Continue wound care.     Family/ staff Communication: reviewed plan of care with Dr. Glade LloydPandey and facility Nurse supervisor.   Labs/tests ordered: None

## 2016-01-29 ENCOUNTER — Encounter: Payer: Self-pay | Admitting: Internal Medicine

## 2016-01-29 NOTE — Progress Notes (Signed)
This encounter was created in error - please disregard.

## 2016-02-12 DEATH — deceased

## 2018-04-22 IMAGING — CT CT RENAL STONE PROTOCOL
2 of 4 series · 16 of 46 positions shown, 18 images · non-contrast
Comparison: Pelvic radiograph 09/08/2012

CLINICAL DATA: Acute kidney injury. Concern for sepsis and urinary
tract infection.

EXAM:
CT ABDOMEN AND PELVIS WITHOUT CONTRAST
TECHNIQUE: Multidetector CT imaging of the abdomen and pelvis was performed
following the standard protocol without IV contrast.

[Series 2: stone study 5.0 i30f 1 · axial · 0.92mm/px · z∈[-811,-381]mm · 13 of 94 slices shown, 15 images]
[im 4/94  soft-tissue]
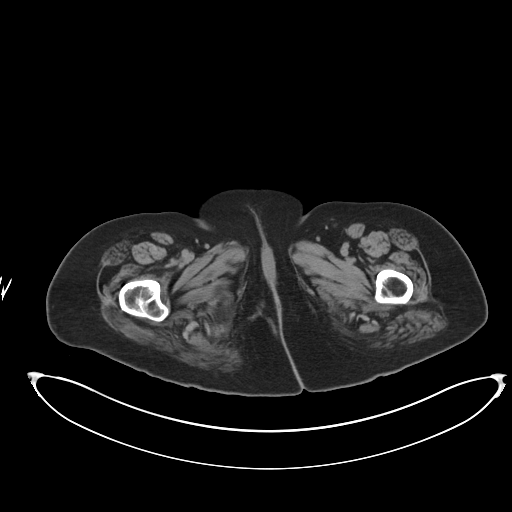
[im 4/94  bone]
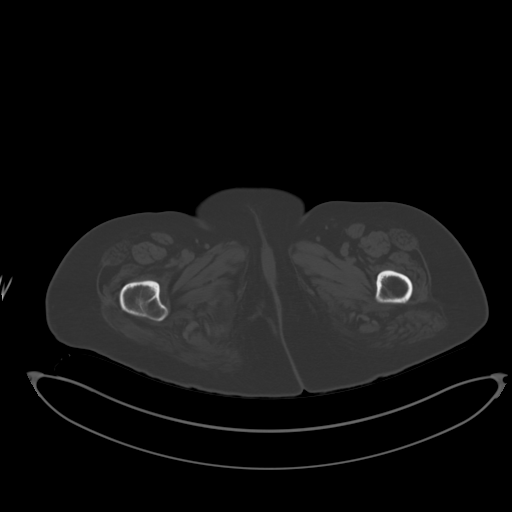
[im 11/94  soft-tissue]
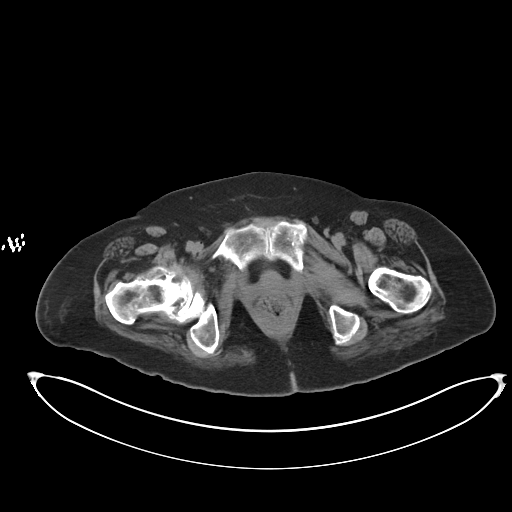
[im 18/94  soft-tissue]
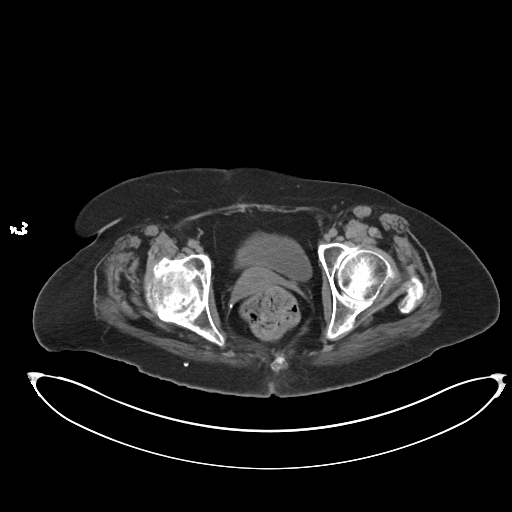
[im 26/94  soft-tissue]
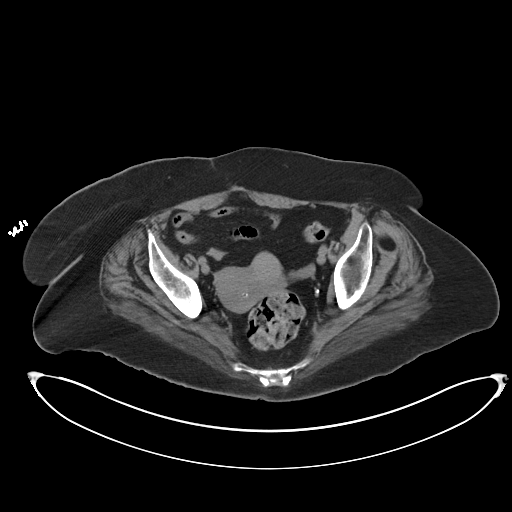
[im 33/94  soft-tissue]
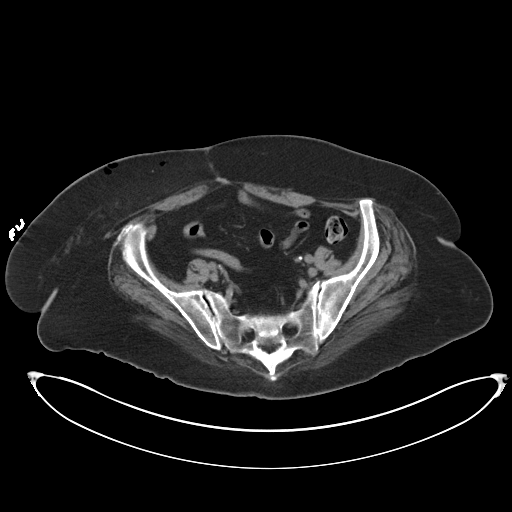
[im 40/94  soft-tissue]
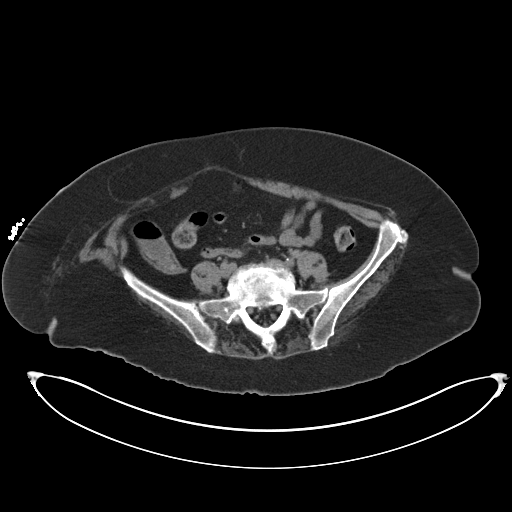
[im 47/94  soft-tissue]
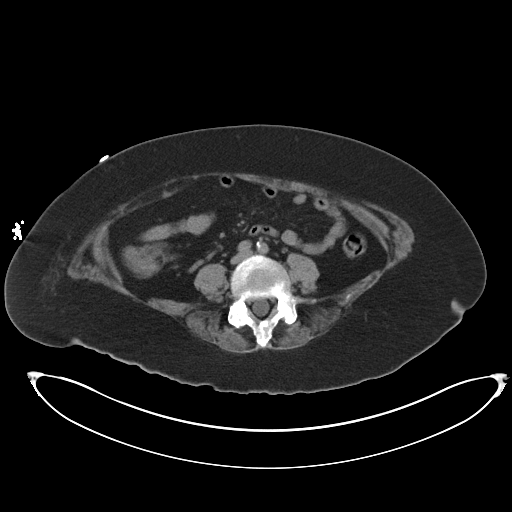
[im 54/94  soft-tissue]
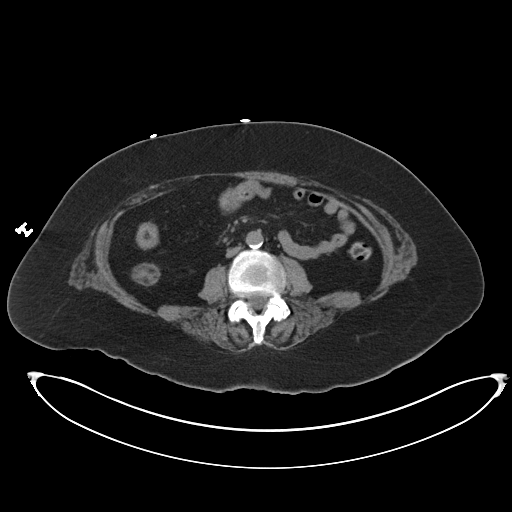
[im 61/94  soft-tissue]
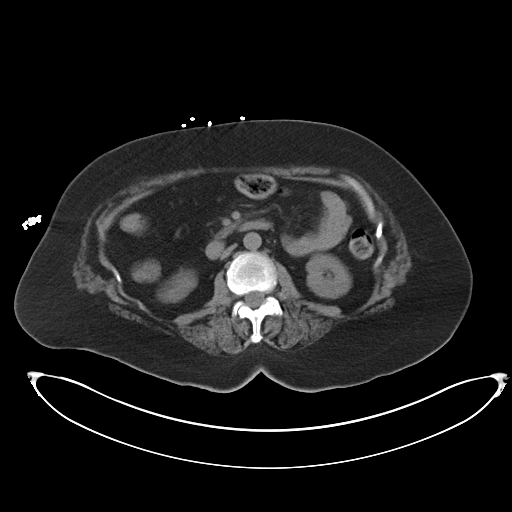
[im 61/94  bone]
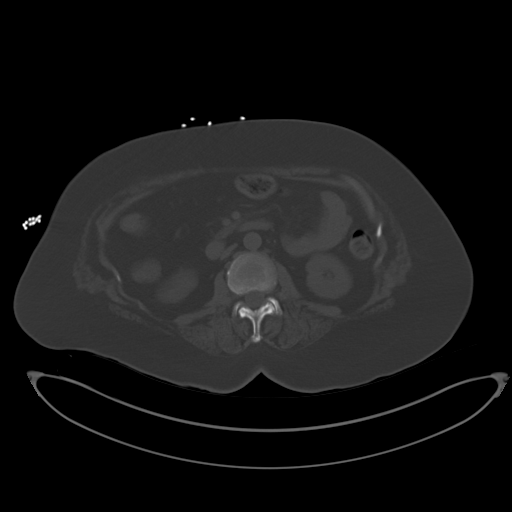
[im 68/94  soft-tissue]
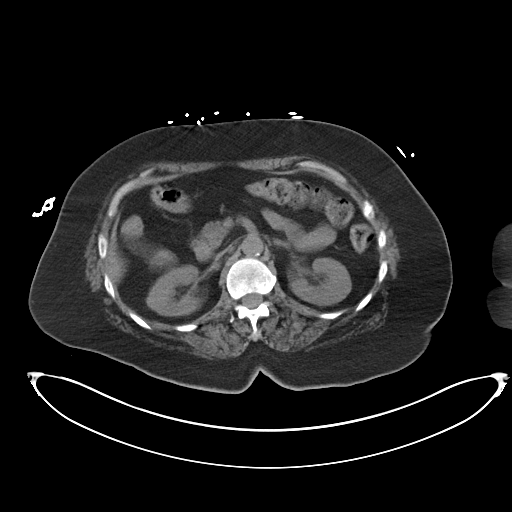
[im 76/94  soft-tissue]
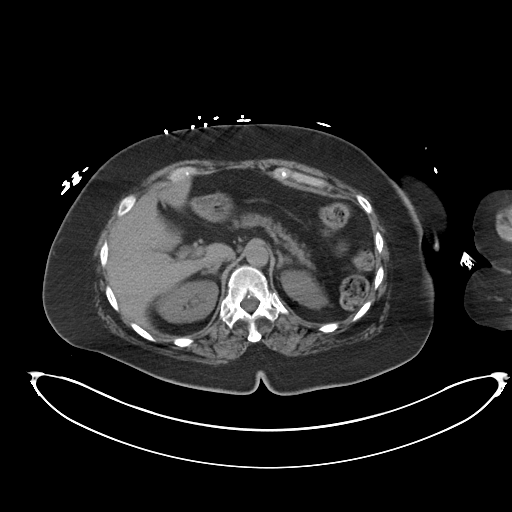
[im 83/94  soft-tissue]
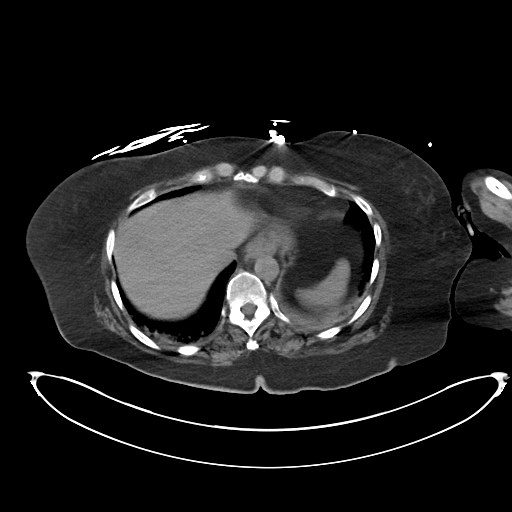
[im 90/94  soft-tissue]
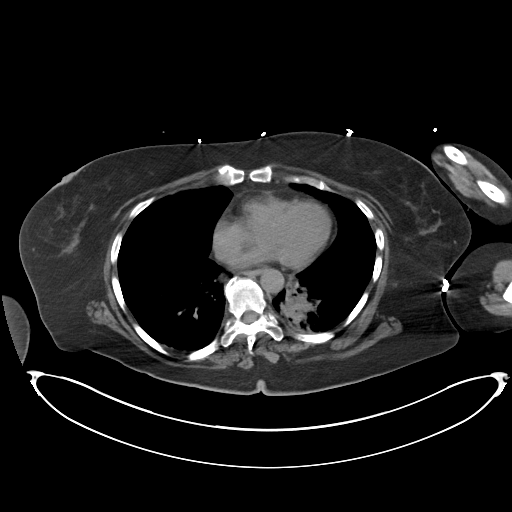

[Series 5: coronal soft tissue · coronal · 0.90mm/px · 3 of 87 slices shown]
[im 29/87  soft-tissue]
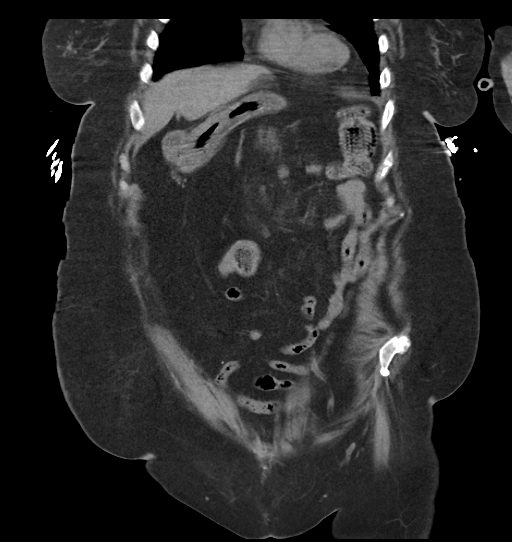
[im 39/87  soft-tissue]
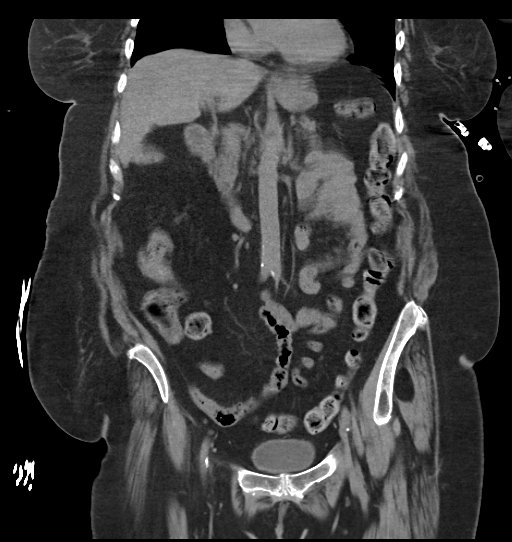
[im 48/87  soft-tissue]
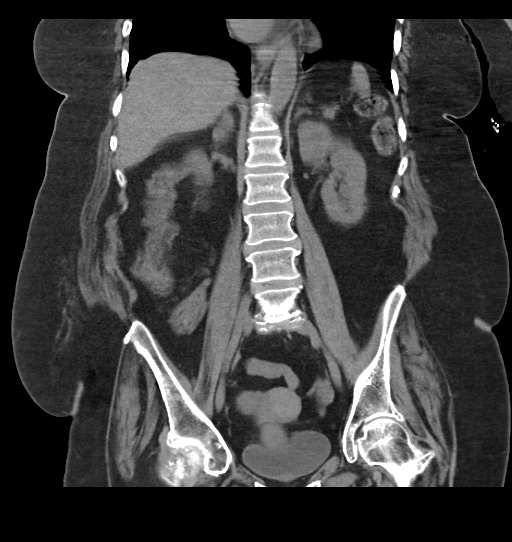

[16 of 46 positions shown; findings below may reference images not displayed]

FINDINGS: Lower chest: Respiratory motion somewhat degrades evaluation of the
lung bases. There is consolidation versus atelectasis in the left
lung base. Milder right basilar atelectasis. No pleural effusion.

Hepatobiliary: Normal hepatic size and contours. No perihepatic
ascites. No intra- or extrahepatic biliary dilatation. The
gallbladder is surgically absent.

Pancreas: Normal pancreatic contours. No peripancreatic fluid
collection or pancreatic ductal dilatation.

Spleen: Small but otherwise normal.

Adrenals/Urinary Tract: Normal adrenal glands. No hydronephrosis or
nephrolithiasis. No abnormal perinephric stranding. No ureteral
obstruction.

Stomach/Bowel: No abnormal bowel dilatation. No bowel wall
thickening or adjacent fat stranding to indicate acute inflammation.
No abdominal fluid collection. Normal appendix.

Vascular/Lymphatic: Atherosclerotic calcification of the non
aneurysmal abdominal aorta. No abdominal or pelvic adenopathy.

Reproductive: Hyperdense lesion at the left uterine fundus measures
3.5 cm. Ovaries are not clearly identified. No free fluid in the
pelvis.

Musculoskeletal: Right greater than left hip osteoarthrosis. Lower
lumbar facet arthrosis and osteophytosis. Small amounts of right
lower quadrant subcutaneous gas. Question recent subcutaneous
injection (such as Lovenox or insulin).

Other: No contributory non-categorized findings.
IMPRESSION: 1. No obstructive uropathy or perinephric stranding. Assessment for
pyelonephritis is limited in the absence of IV contrast.
2. Consolidative opacity in the left lower lobe, infection versus
atelectasis.
3. Subcutaneous gas in the right lower quadrant is likely
iatrogenic. Correlate for recent subcutaneous injection.
4. Aortic atherosclerosis.

## 2018-04-23 IMAGING — DX DG CHEST 2V
3 series · 3 of 3 positions shown · non-contrast
Comparison: 12/15/2015

CLINICAL DATA: Generalized weakness

EXAM:
CHEST  2 VIEW

[chest lat (1 of 2)]
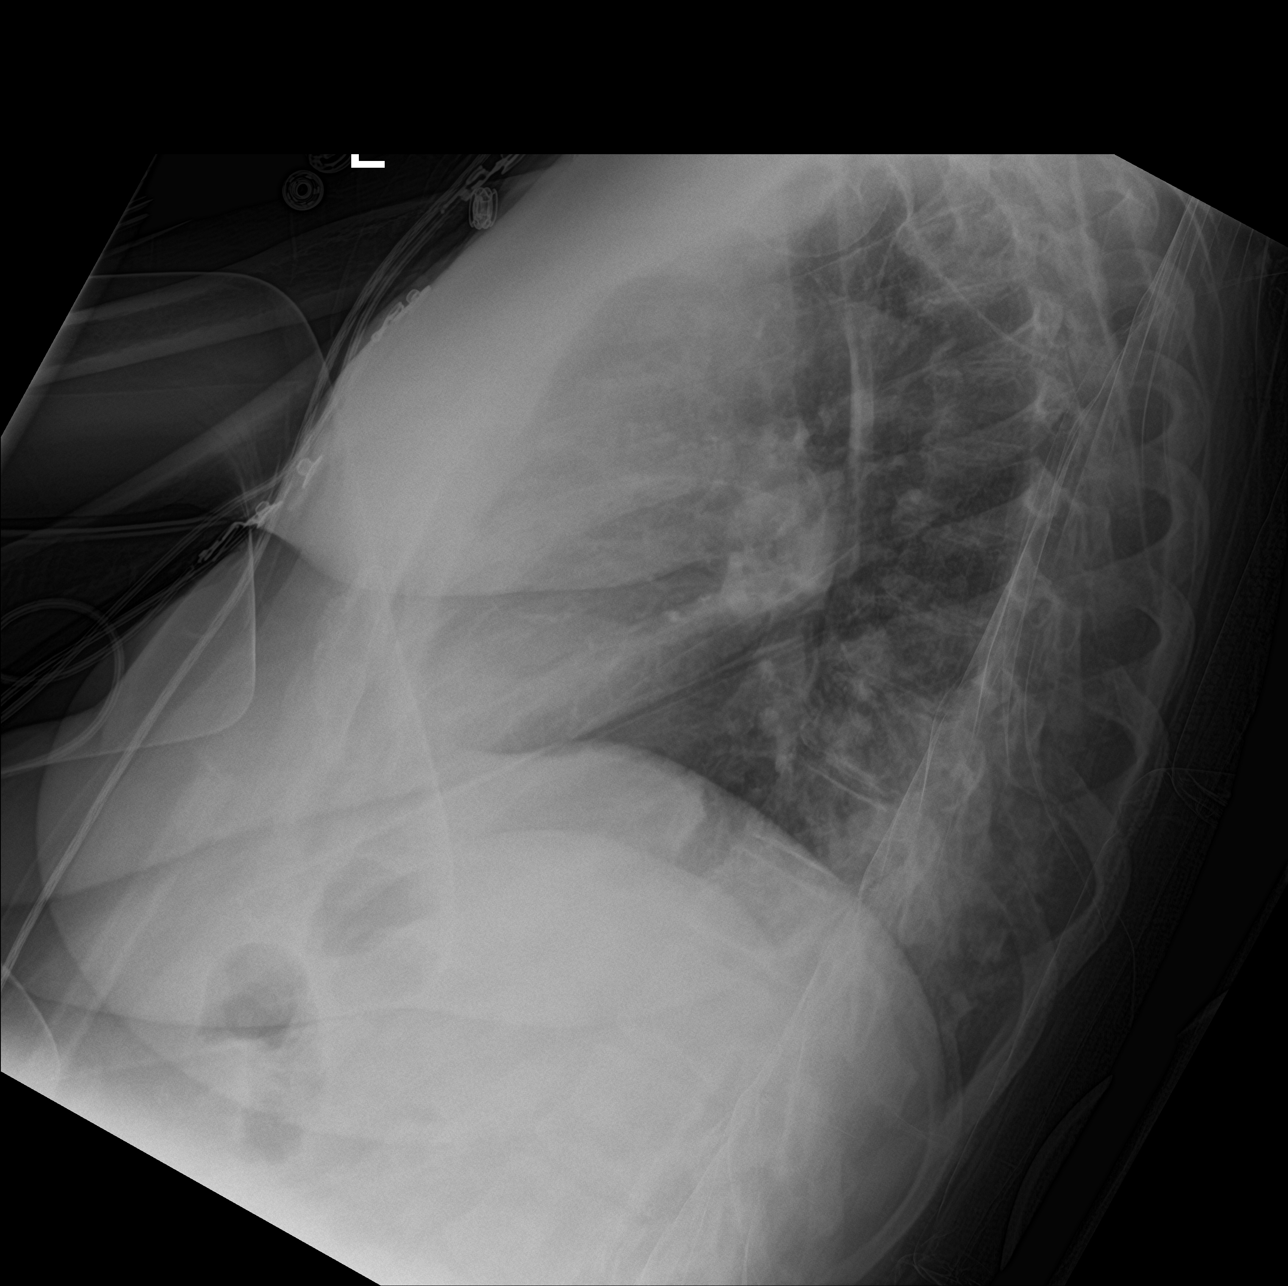

[chest ap]
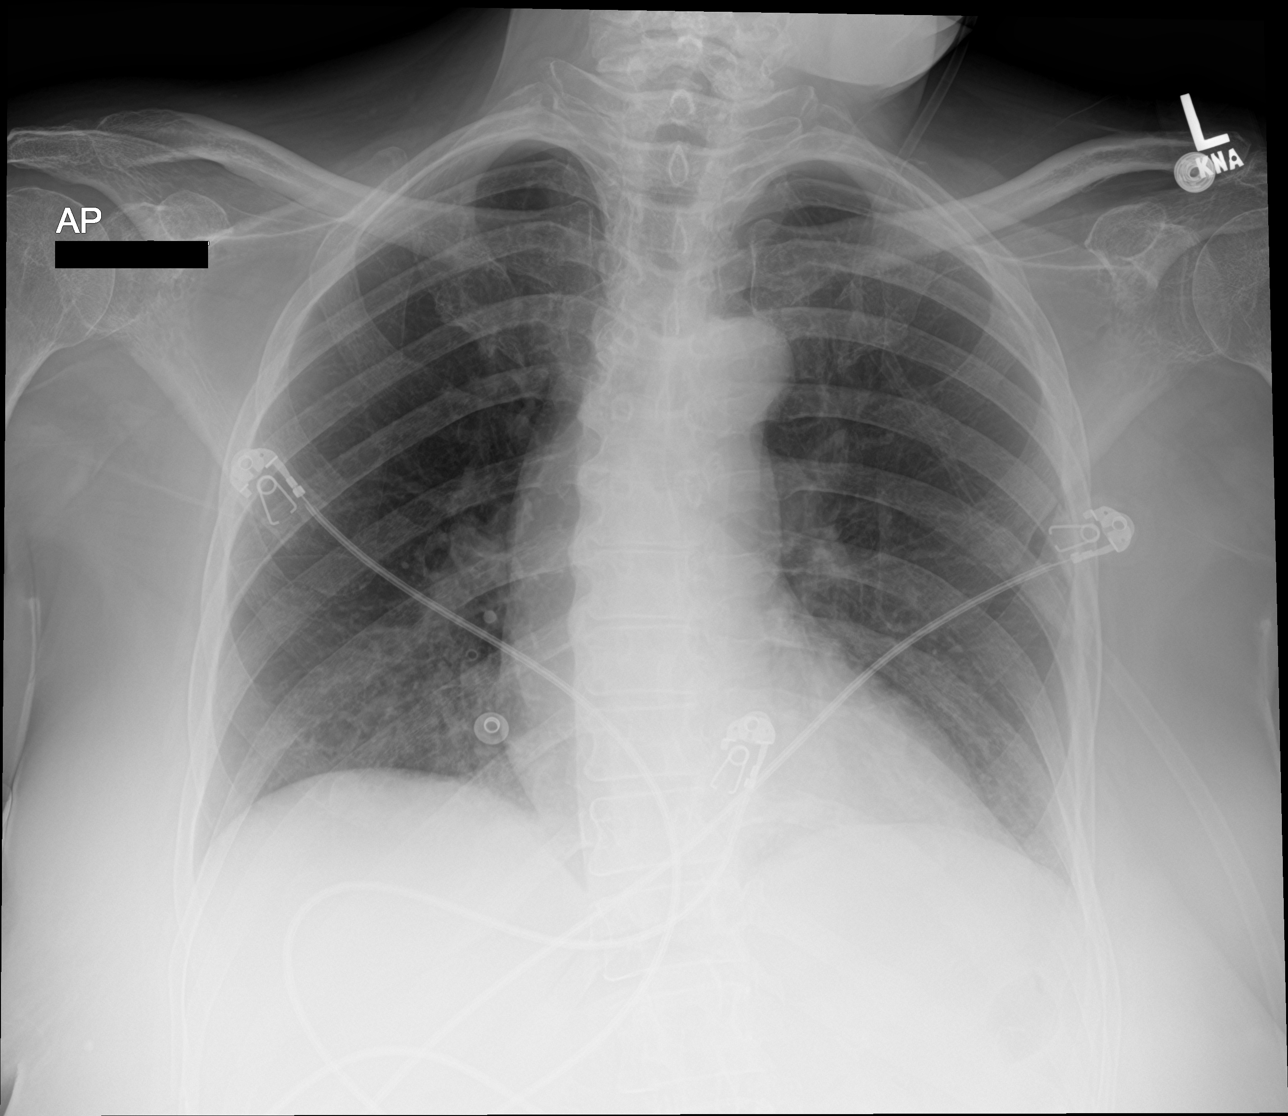

[chest lat (2 of 2)]
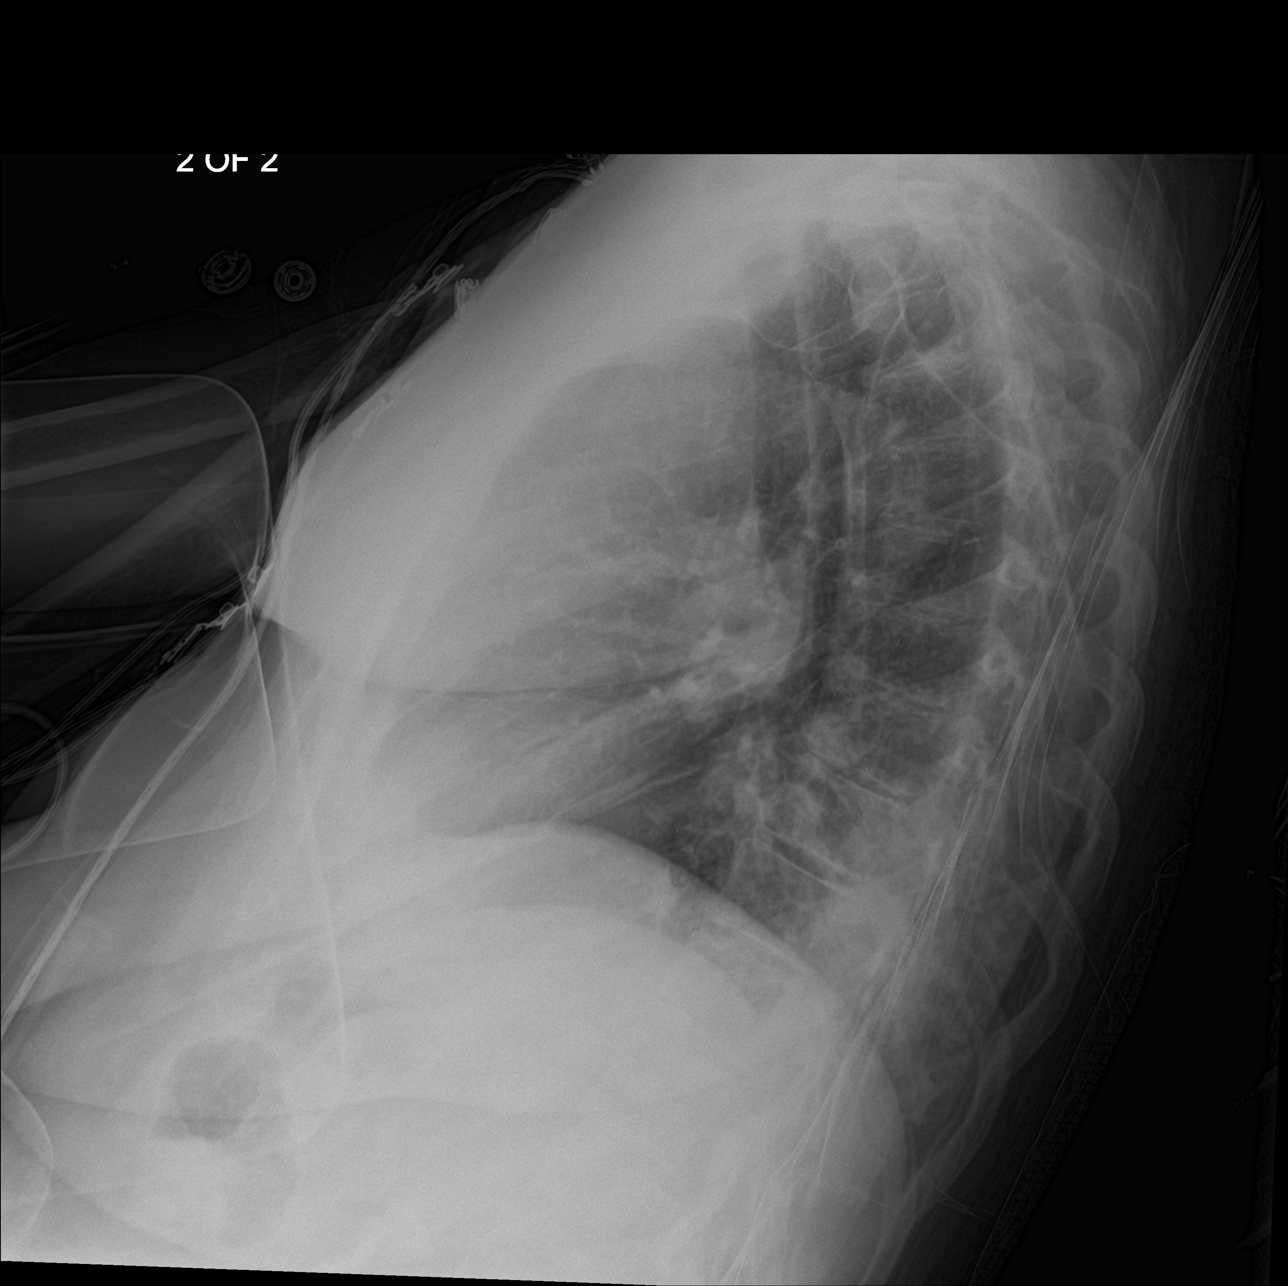

[3 of 3 positions shown; findings below may reference images not displayed]

FINDINGS: Cardiac shadow is stable. The lungs are well aerated bilaterally. No
focal infiltrate or sizable effusion is seen. No acute bony
abnormality is noted.
IMPRESSION: No active cardiopulmonary disease.
